# Patient Record
Sex: Female | Born: 1990 | Race: Black or African American | Hispanic: No | Marital: Single | Smoking: Current every day smoker
Health system: Southern US, Community
[De-identification: ages and names within clinical notes are randomized; demographics above are authoritative.]

## PROBLEM LIST (undated history)

## (undated) ENCOUNTER — Inpatient Hospital Stay (HOSPITAL_COMMUNITY): Payer: Self-pay

## (undated) DIAGNOSIS — O99345 Other mental disorders complicating the puerperium: Secondary | ICD-10-CM

## (undated) DIAGNOSIS — R569 Unspecified convulsions: Secondary | ICD-10-CM

## (undated) DIAGNOSIS — IMO0001 Reserved for inherently not codable concepts without codable children: Secondary | ICD-10-CM

## (undated) DIAGNOSIS — F53 Postpartum depression: Secondary | ICD-10-CM

## (undated) DIAGNOSIS — T8859XA Other complications of anesthesia, initial encounter: Secondary | ICD-10-CM

## (undated) DIAGNOSIS — K219 Gastro-esophageal reflux disease without esophagitis: Secondary | ICD-10-CM

## (undated) DIAGNOSIS — I10 Essential (primary) hypertension: Secondary | ICD-10-CM

## (undated) DIAGNOSIS — A749 Chlamydial infection, unspecified: Secondary | ICD-10-CM

## (undated) DIAGNOSIS — T4145XA Adverse effect of unspecified anesthetic, initial encounter: Secondary | ICD-10-CM

## (undated) HISTORY — DX: Other mental disorders complicating the puerperium: O99.345

## (undated) HISTORY — DX: Other complications of anesthesia, initial encounter: T88.59XA

## (undated) HISTORY — PX: DILATION AND CURETTAGE OF UTERUS: SHX78

## (undated) HISTORY — DX: Essential (primary) hypertension: I10

## (undated) HISTORY — DX: Reserved for inherently not codable concepts without codable children: IMO0001

## (undated) HISTORY — DX: Gastro-esophageal reflux disease without esophagitis: K21.9

## (undated) HISTORY — DX: Postpartum depression: F53.0

## (undated) HISTORY — DX: Adverse effect of unspecified anesthetic, initial encounter: T41.45XA

## (undated) HISTORY — DX: Chlamydial infection, unspecified: A74.9

---
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
## 2008-10-08 ENCOUNTER — Inpatient Hospital Stay (HOSPITAL_COMMUNITY): Admission: AD | Admit: 2008-10-08 | Discharge: 2008-10-08 | Payer: Self-pay | Admitting: Obstetrics & Gynecology

## 2008-11-11 ENCOUNTER — Inpatient Hospital Stay (HOSPITAL_COMMUNITY): Admission: AD | Admit: 2008-11-11 | Discharge: 2008-11-11 | Payer: Self-pay | Admitting: Obstetrics & Gynecology

## 2009-01-27 ENCOUNTER — Inpatient Hospital Stay (HOSPITAL_COMMUNITY): Admission: AD | Admit: 2009-01-27 | Discharge: 2009-01-27 | Payer: Self-pay | Admitting: Obstetrics & Gynecology

## 2009-01-27 ENCOUNTER — Ambulatory Visit: Payer: Self-pay | Admitting: Advanced Practice Midwife

## 2009-02-22 ENCOUNTER — Inpatient Hospital Stay (HOSPITAL_COMMUNITY): Admission: AD | Admit: 2009-02-22 | Discharge: 2009-02-24 | Payer: Self-pay | Admitting: Obstetrics and Gynecology

## 2009-02-22 ENCOUNTER — Ambulatory Visit: Payer: Self-pay | Admitting: Family

## 2009-05-31 LAB — GC/CHLAMYDIA PROBE AMP, GENITAL: Chlamydia: NEGATIVE

## 2009-06-05 ENCOUNTER — Emergency Department (HOSPITAL_COMMUNITY): Admission: EM | Admit: 2009-06-05 | Discharge: 2009-06-05 | Payer: Self-pay | Admitting: Emergency Medicine

## 2009-07-09 ENCOUNTER — Emergency Department (HOSPITAL_COMMUNITY): Admission: EM | Admit: 2009-07-09 | Discharge: 2009-07-09 | Payer: Self-pay | Admitting: Emergency Medicine

## 2010-03-05 ENCOUNTER — Inpatient Hospital Stay (HOSPITAL_COMMUNITY): Admission: AD | Admit: 2010-03-05 | Discharge: 2010-03-05 | Payer: Self-pay | Admitting: Obstetrics & Gynecology

## 2010-03-05 ENCOUNTER — Ambulatory Visit: Payer: Self-pay | Admitting: Obstetrics and Gynecology

## 2010-03-13 ENCOUNTER — Ambulatory Visit (HOSPITAL_COMMUNITY): Admission: RE | Admit: 2010-03-13 | Discharge: 2010-03-13 | Payer: Self-pay | Admitting: Obstetrics & Gynecology

## 2010-03-24 ENCOUNTER — Ambulatory Visit: Payer: Self-pay | Admitting: Family Medicine

## 2010-03-24 ENCOUNTER — Inpatient Hospital Stay (HOSPITAL_COMMUNITY): Admission: AD | Admit: 2010-03-24 | Discharge: 2010-03-24 | Payer: Self-pay | Admitting: Obstetrics & Gynecology

## 2010-05-07 ENCOUNTER — Inpatient Hospital Stay (HOSPITAL_COMMUNITY)
Admission: AD | Admit: 2010-05-07 | Discharge: 2010-05-07 | Payer: Self-pay | Source: Home / Self Care | Attending: Family Medicine | Admitting: Family Medicine

## 2010-05-24 ENCOUNTER — Inpatient Hospital Stay (HOSPITAL_COMMUNITY)
Admission: AD | Admit: 2010-05-24 | Discharge: 2010-05-24 | Payer: Self-pay | Source: Home / Self Care | Attending: Obstetrics & Gynecology | Admitting: Obstetrics & Gynecology

## 2010-05-26 ENCOUNTER — Inpatient Hospital Stay (HOSPITAL_COMMUNITY)
Admission: AD | Admit: 2010-05-26 | Discharge: 2010-05-26 | Payer: Self-pay | Source: Home / Self Care | Attending: Obstetrics & Gynecology | Admitting: Obstetrics & Gynecology

## 2010-05-28 DIAGNOSIS — R569 Unspecified convulsions: Secondary | ICD-10-CM

## 2010-05-28 HISTORY — DX: Unspecified convulsions: R56.9

## 2010-05-31 ENCOUNTER — Encounter: Payer: Self-pay | Admitting: Obstetrics and Gynecology

## 2010-05-31 ENCOUNTER — Ambulatory Visit
Admission: RE | Admit: 2010-05-31 | Discharge: 2010-05-31 | Payer: Self-pay | Source: Home / Self Care | Attending: Obstetrics and Gynecology | Admitting: Obstetrics and Gynecology

## 2010-05-31 LAB — CONVERTED CEMR LAB
Chlamydia, Swab/Urine, PCR: NEGATIVE
GC Probe Amp, Urine: NEGATIVE

## 2010-05-31 LAB — POCT URINALYSIS DIPSTICK
Bilirubin Urine: NEGATIVE
Hemoglobin, Urine: NEGATIVE
Ketones, ur: 80 mg/dL — AB
Nitrite: NEGATIVE
Protein, ur: NEGATIVE mg/dL
Specific Gravity, Urine: 1.01 (ref 1.005–1.030)
Urine Glucose, Fasting: NEGATIVE mg/dL
Urobilinogen, UA: 4 mg/dL — ABNORMAL HIGH (ref 0.0–1.0)
pH: 6.5 (ref 5.0–8.0)

## 2010-05-31 LAB — GLUCOSE, CAPILLARY: Glucose-Capillary: 88 mg/dL (ref 70–99)

## 2010-06-14 ENCOUNTER — Ambulatory Visit: Admit: 2010-06-14 | Payer: Self-pay | Admitting: Obstetrics and Gynecology

## 2010-06-21 ENCOUNTER — Ambulatory Visit
Admission: RE | Admit: 2010-06-21 | Discharge: 2010-06-21 | Payer: Self-pay | Source: Home / Self Care | Attending: Obstetrics and Gynecology | Admitting: Obstetrics and Gynecology

## 2010-06-21 ENCOUNTER — Inpatient Hospital Stay (HOSPITAL_COMMUNITY)
Admission: AD | Admit: 2010-06-21 | Discharge: 2010-06-21 | Payer: Self-pay | Source: Home / Self Care | Attending: Obstetrics & Gynecology | Admitting: Obstetrics & Gynecology

## 2010-06-21 LAB — COMPREHENSIVE METABOLIC PANEL
ALT: 10 U/L (ref 0–35)
AST: 17 U/L (ref 0–37)
Albumin: 2.5 g/dL — ABNORMAL LOW (ref 3.5–5.2)
Alkaline Phosphatase: 234 U/L — ABNORMAL HIGH (ref 39–117)
BUN: 3 mg/dL — ABNORMAL LOW (ref 6–23)
CO2: 23 mEq/L (ref 19–32)
Calcium: 8.7 mg/dL (ref 8.4–10.5)
Chloride: 110 mEq/L (ref 96–112)
Creatinine, Ser: 0.78 mg/dL (ref 0.4–1.2)
GFR calc Af Amer: >60 mL/min (ref >60)
GFR calc non Af Amer: >60 mL/min (ref >60)
Glucose, Bld: 79 mg/dL (ref 70–99)
Potassium: 3.9 mEq/L (ref 3.5–5.1)
Sodium: 138 mEq/L (ref 135–145)
Total Bilirubin: 0.4 mg/dL (ref 0.3–1.2)
Total Protein: 5.9 g/dL — ABNORMAL LOW (ref 6.0–8.3)

## 2010-06-21 LAB — POCT URINALYSIS DIPSTICK
Bilirubin Urine: NEGATIVE
Hgb urine dipstick: NEGATIVE
Ketones, ur: NEGATIVE mg/dL
Nitrite: NEGATIVE
Protein, ur: NEGATIVE mg/dL
Specific Gravity, Urine: 1.01 (ref 1.005–1.030)
Urine Glucose, Fasting: NEGATIVE mg/dL
Urobilinogen, UA: 0.2 mg/dL (ref 0.0–1.0)
pH: 6.5 (ref 5.0–8.0)

## 2010-06-21 LAB — CBC
HCT: 31.1 % — ABNORMAL LOW (ref 36.0–46.0)
Hemoglobin: 10.1 g/dL — ABNORMAL LOW (ref 12.0–15.0)
MCH: 30.9 pg (ref 26.0–34.0)
MCHC: 32.5 g/dL (ref 30.0–36.0)
MCV: 95.1 fL (ref 78.0–100.0)
Platelets: 210 10*3/uL (ref 150–400)
RBC: 3.27 MIL/uL — ABNORMAL LOW (ref 3.87–5.11)
RDW: 13 % (ref 11.5–15.5)
WBC: 8.4 10*3/uL (ref 4.0–10.5)

## 2010-06-21 LAB — URINALYSIS, ROUTINE W REFLEX MICROSCOPIC
Bilirubin Urine: NEGATIVE
Hgb urine dipstick: NEGATIVE
Ketones, ur: NEGATIVE mg/dL
Nitrite: NEGATIVE
Protein, ur: NEGATIVE mg/dL
Specific Gravity, Urine: 1.01 (ref 1.005–1.030)
Urine Glucose, Fasting: NEGATIVE mg/dL
Urobilinogen, UA: 0.2 mg/dL (ref 0.0–1.0)
pH: 7 (ref 5.0–8.0)

## 2010-06-21 LAB — URINE MICROSCOPIC-ADD ON

## 2010-06-21 LAB — PROTEIN / CREATININE RATIO, URINE
Creatinine, Urine: 79.8 mg/dL
Protein Creatinine Ratio: 0.13 (ref 0.00–0.15)
Total Protein, Urine: 10 mg/dL

## 2010-06-22 ENCOUNTER — Inpatient Hospital Stay (HOSPITAL_COMMUNITY)
Admission: AD | Admit: 2010-06-22 | Discharge: 2010-06-25 | Payer: Self-pay | Source: Home / Self Care | Attending: Obstetrics & Gynecology | Admitting: Obstetrics & Gynecology

## 2010-06-22 LAB — CBC
HCT: 30.7 % — ABNORMAL LOW (ref 36.0–46.0)
Hemoglobin: 10 g/dL — ABNORMAL LOW (ref 12.0–15.0)
MCH: 31 pg (ref 26.0–34.0)
MCHC: 32.6 g/dL (ref 30.0–36.0)
MCV: 95 fL (ref 78.0–100.0)
Platelets: 204 10*3/uL (ref 150–400)
RBC: 3.23 MIL/uL — ABNORMAL LOW (ref 3.87–5.11)
RDW: 13.1 % (ref 11.5–15.5)
WBC: 9.7 10*3/uL (ref 4.0–10.5)

## 2010-06-22 LAB — RPR: RPR Ser Ql: NONREACTIVE
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
## 2010-06-24 LAB — CBC
HCT: 26.6 % — ABNORMAL LOW (ref 36.0–46.0)
Hemoglobin: 8.8 g/dL — ABNORMAL LOW (ref 12.0–15.0)
MCH: 31.2 pg (ref 26.0–34.0)
MCHC: 33.1 g/dL (ref 30.0–36.0)
MCV: 94.3 fL (ref 78.0–100.0)
Platelets: 193 10*3/uL (ref 150–400)
RBC: 2.82 MIL/uL — ABNORMAL LOW (ref 3.87–5.11)
RDW: 13 % (ref 11.5–15.5)
WBC: 10.7 10*3/uL — ABNORMAL HIGH (ref 4.0–10.5)

## 2010-07-01 ENCOUNTER — Inpatient Hospital Stay (HOSPITAL_COMMUNITY)
Admission: AD | Admit: 2010-07-01 | Discharge: 2010-07-02 | Disposition: A | Discharge status: Home / Self Care | Payer: Medicaid Other | Source: Ambulatory Visit | Attending: Obstetrics & Gynecology | Admitting: Obstetrics & Gynecology

## 2010-07-01 DIAGNOSIS — H5710 Ocular pain, unspecified eye: Secondary | ICD-10-CM

## 2010-07-01 DIAGNOSIS — O9989 Other specified diseases and conditions complicating pregnancy, childbirth and the puerperium: Secondary | ICD-10-CM

## 2010-07-01 DIAGNOSIS — R6884 Jaw pain: Secondary | ICD-10-CM | POA: Insufficient documentation

## 2010-07-01 DIAGNOSIS — O99893 Other specified diseases and conditions complicating puerperium: Secondary | ICD-10-CM | POA: Insufficient documentation

## 2010-07-01 LAB — COMPREHENSIVE METABOLIC PANEL
ALT: 15 U/L (ref 0–35)
AST: 20 U/L (ref 0–37)
Albumin: 3 g/dL — ABNORMAL LOW (ref 3.5–5.2)
Alkaline Phosphatase: 127 U/L — ABNORMAL HIGH (ref 39–117)
BUN: 8 mg/dL (ref 6–23)
CO2: 23 mEq/L (ref 19–32)
Calcium: 9 mg/dL (ref 8.4–10.5)
Chloride: 110 mEq/L (ref 96–112)
Creatinine, Ser: 0.77 mg/dL (ref 0.4–1.2)
GFR calc Af Amer: >60 mL/min (ref >60)
GFR calc non Af Amer: >60 mL/min (ref >60)
Glucose, Bld: 86 mg/dL (ref 70–99)
Potassium: 4.5 mEq/L (ref 3.5–5.1)
Sodium: 141 mEq/L (ref 135–145)
Total Bilirubin: 0.4 mg/dL (ref 0.3–1.2)
Total Protein: 6 g/dL (ref 6.0–8.3)

## 2010-07-01 LAB — URINALYSIS, ROUTINE W REFLEX MICROSCOPIC
Bilirubin Urine: NEGATIVE
Hgb urine dipstick: NEGATIVE
Ketones, ur: NEGATIVE mg/dL
Nitrite: NEGATIVE
Protein, ur: NEGATIVE mg/dL
Specific Gravity, Urine: 1.015 (ref 1.005–1.030)
Urine Glucose, Fasting: NEGATIVE mg/dL
Urobilinogen, UA: 1 mg/dL (ref 0.0–1.0)
pH: 6 (ref 5.0–8.0)

## 2010-07-01 LAB — LACTATE DEHYDROGENASE: LDH: 210 U/L (ref 94–250)

## 2010-07-01 LAB — CBC
HCT: 33.2 % — ABNORMAL LOW (ref 36.0–46.0)
Hemoglobin: 10.6 g/dL — ABNORMAL LOW (ref 12.0–15.0)
MCH: 31.2 pg (ref 26.0–34.0)
MCHC: 31.9 g/dL (ref 30.0–36.0)
MCV: 97.6 fL (ref 78.0–100.0)
Platelets: 369 10*3/uL (ref 150–400)
RBC: 3.4 MIL/uL — ABNORMAL LOW (ref 3.87–5.11)
RDW: 14 % (ref 11.5–15.5)
WBC: 7.2 10*3/uL (ref 4.0–10.5)

## 2010-07-01 LAB — URIC ACID: Uric Acid, Serum: 5.2 mg/dL (ref 2.4–7.0)

## 2010-07-13 ENCOUNTER — Ambulatory Visit: Payer: Medicaid Other | Admitting: Obstetrics & Gynecology

## 2010-08-07 LAB — URINALYSIS, ROUTINE W REFLEX MICROSCOPIC
Nitrite: NEGATIVE
Specific Gravity, Urine: <1.005 — ABNORMAL LOW (ref 1.005–1.030)
Urobilinogen, UA: 0.2 mg/dL (ref 0.0–1.0)
pH: 6 (ref 5.0–8.0)

## 2010-08-07 LAB — TYPE AND SCREEN: ABO/RH(D): O POS

## 2010-08-07 LAB — DIFFERENTIAL
Basophils Absolute: 0 10*3/uL (ref 0.0–0.1)
Eosinophils Relative: 1 % (ref 0–5)
Lymphocytes Relative: 29 % (ref 12–46)
Neutro Abs: 6.7 10*3/uL (ref 1.7–7.7)
Neutrophils Relative %: 64 % (ref 43–77)

## 2010-08-07 LAB — CBC
MCV: 95.9 fL (ref 78.0–100.0)
Platelets: 219 10*3/uL (ref 150–400)
RDW: 12.4 % (ref 11.5–15.5)
WBC: 10.8 10*3/uL — ABNORMAL HIGH (ref 4.0–10.5)

## 2010-08-07 LAB — STREP B DNA PROBE: Strep Group B Ag: NEGATIVE

## 2010-08-07 LAB — HEPATITIS B SURFACE ANTIGEN: Hepatitis B Surface Ag: NEGATIVE

## 2010-08-07 LAB — WET PREP, GENITAL

## 2010-08-07 LAB — RAPID URINE DRUG SCREEN, HOSP PERFORMED
Barbiturates: NOT DETECTED
Opiates: NOT DETECTED

## 2010-08-10 LAB — URINE MICROSCOPIC-ADD ON: RBC / HPF: NONE SEEN RBC/hpf (ref <3)

## 2010-08-10 LAB — URINALYSIS, ROUTINE W REFLEX MICROSCOPIC
Ketones, ur: NEGATIVE mg/dL
Nitrite: NEGATIVE
Protein, ur: NEGATIVE mg/dL
pH: 6.5 (ref 5.0–8.0)

## 2010-08-23 ENCOUNTER — Ambulatory Visit: Payer: Medicaid Other | Admitting: Obstetrics & Gynecology

## 2010-09-01 LAB — CBC
HCT: 36.5 % (ref 36.0–46.0)
HCT: 32.5 % — ABNORMAL LOW (ref 36.0–46.0)
Hemoglobin: 12.1 g/dL (ref 12.0–15.0)
MCHC: 33.3 g/dL (ref 30.0–36.0)
MCV: 102.2 fL — ABNORMAL HIGH (ref 78.0–100.0)
MCV: 103.1 fL — ABNORMAL HIGH (ref 78.0–100.0)
Platelets: 202 10*3/uL (ref 150–400)
RDW: 13.2 % (ref 11.5–15.5)
RDW: 12.8 % (ref 11.5–15.5)

## 2010-09-01 LAB — RAPID URINE DRUG SCREEN, HOSP PERFORMED
Cocaine: NOT DETECTED
Opiates: NOT DETECTED

## 2010-09-01 LAB — RUBELLA SCREEN: Rubella: 107.9 IU/mL — ABNORMAL HIGH

## 2010-09-01 LAB — DIFFERENTIAL
Basophils Relative: 0 % (ref 0–1)
Lymphocytes Relative: 16 % (ref 12–46)
Monocytes Relative: 5 % (ref 3–12)

## 2010-09-01 LAB — TYPE AND SCREEN: Antibody Screen: NEGATIVE

## 2010-09-01 LAB — RPR: RPR Ser Ql: NONREACTIVE

## 2010-09-01 LAB — SICKLE CELL SCREEN: Sickle Cell Screen: NEGATIVE

## 2010-09-01 LAB — HIV ANTIBODY (ROUTINE TESTING W REFLEX): HIV: NONREACTIVE

## 2010-09-04 LAB — WET PREP, GENITAL

## 2010-09-04 LAB — CBC
Hemoglobin: 10.1 g/dL — ABNORMAL LOW (ref 12.0–15.0)
MCHC: 33.8 g/dL (ref 30.0–36.0)
MCV: 102.8 fL — ABNORMAL HIGH (ref 78.0–100.0)
RBC: 2.91 MIL/uL — ABNORMAL LOW (ref 3.87–5.11)

## 2010-09-04 LAB — URINALYSIS, ROUTINE W REFLEX MICROSCOPIC
Bilirubin Urine: NEGATIVE
Ketones, ur: NEGATIVE mg/dL
Nitrite: NEGATIVE
Protein, ur: NEGATIVE mg/dL
Urobilinogen, UA: 0.2 mg/dL (ref 0.0–1.0)

## 2010-10-10 NOTE — Discharge Summary (Signed)
NAME:  Shirley Tran, Shirley Tran               ACCOUNT NO.:  [id number]   MEDICAL RECORD NO.:  [id number]          PATIENT TYPE:  INP   LOCATION:  9372                          FACILITY:  [facility]   PHYSICIAN:  Norton Blizzard, MD    DATE OF BIRTH:  Feb 21, 1991   DATE OF ADMISSION:  08/27/2007  DATE OF DISCHARGE:  08/28/2007                               DISCHARGE SUMMARY   ADMISSION DIAGNOSES:  1. Status post spontaneous vaginal delivery on August 14, 2007.  2. Severe range of blood pressures and headache.   DISCHARGE DIAGNOSES:  1. Status post spontaneous vaginal delivery on August 14, 2007.  2. Severe range of blood pressures and a headache.   PROCEDURES:  Magnesium sulfate administration for 24 hours.   HOSPITAL COURSE:  The patient is an 20 year old G2, P 1-0-1-1, status  post vaginal delivery on August 14, 2007.  The patient had no history of  hypertension in her prenatal care or in her immediate postpartum period.  She did present to [facility]U on August 27, 2007, with severe headache and on  evaluation was found to have blood pressures in  the 160 range for  systolic blood pressure and 120 range for diastolic blood pressure. Of  note, her PIH panel and labs were all negative and she had no other  evidence of end-organ dysfunction or damage.  Given her elevated blood  pressures and headaches that increased concern for possible  preeclampsia, she was admitted for magnesium sulfate administration.  The patient also was given labetalol 200 t.i.d. for management of her  blood pressures.  While on the magnesium sulfate, she had no signs or  symptoms of magnesium toxicity.  She tolerated her medications well.  Her blood pressures were in the 120s to 130s over 70s to 80s.  She did  have a mild headache, which was ameliorated with Motrin and Percocet.  The patient denied any other visual changes or right upper quadrant or  epigastric pain.  After her magnesium administration, the patient  desired to go home.   She was sent home after 2 hours of monitoring after  her magnesium therapy, and preeclampsia and eclampsia precautions were  reviewed with the patient.   CONDITION ON DISCHARGE:  The patient was discharged to home in stable  condition.   DISCHARGE MEDICATIONS:  1. Toprol-XL 50 mg p.o. daily.  2. Hydrochlorothiazide 25 mg p.o. daily.  3. Fioricet 1-2 tablets p.o. q.4 h. p.r.n. headaches.  4. Ibuprofen 800 mg p.o. t.i.d.   DISCHARGE INSTRUCTIONS:  The patient has an appointment for blood  pressure check in the [facility] on September 04, 2007, at 09:45 a.m.  This appointment time was communicated with the  patient.  The  patient was told to return to MAU for worsening headaches or any other  symptoms.  If the patient does return with worsening  headache, she  might need further imaging to exclude intracranial pathology.  The  patient was also told to follow up for her postpartum appointments as  previously scheduled and to return for any other postpartum concerns.  Norton Blizzard, MD  Electronically Signed     UAD/MEDQ  D:  08/28/2007  T:  08/29/2007  Job:  [id number]

## 2011-02-19 ENCOUNTER — Emergency Department (HOSPITAL_COMMUNITY)
Admission: EM | Admit: 2011-02-19 | Discharge: 2011-02-19 | Discharge status: Against Medical Advice | Payer: Medicaid Other | Attending: Emergency Medicine | Admitting: Emergency Medicine

## 2011-02-19 ENCOUNTER — Inpatient Hospital Stay (INDEPENDENT_AMBULATORY_CARE_PROVIDER_SITE_OTHER): Admit: 2011-02-19 | Discharge: 2011-02-19 | Disposition: A | Discharge status: Home / Self Care | Payer: Medicaid Other

## 2011-02-19 DIAGNOSIS — Z0389 Encounter for observation for other suspected diseases and conditions ruled out: Secondary | ICD-10-CM | POA: Insufficient documentation

## 2011-02-19 DIAGNOSIS — L03319 Cellulitis of trunk, unspecified: Secondary | ICD-10-CM

## 2011-02-19 LAB — RAPID HIV SCREEN (WH-MAU): Rapid HIV Screen: NONREACTIVE

## 2011-02-19 LAB — URINALYSIS, ROUTINE W REFLEX MICROSCOPIC
Ketones, ur: 15 — AB
Nitrite: NEGATIVE
Specific Gravity, Urine: <1.005 — ABNORMAL LOW
Urobilinogen, UA: 0.2
pH: 6

## 2011-02-19 LAB — COMPREHENSIVE METABOLIC PANEL
Albumin: 2.9 — ABNORMAL LOW
Alkaline Phosphatase: 208 — ABNORMAL HIGH
BUN: 5 — ABNORMAL LOW
Calcium: 8.7
Potassium: 3.2 — ABNORMAL LOW
Total Protein: 5.8 — ABNORMAL LOW

## 2011-02-19 LAB — CBC
Hemoglobin: 11.9 — ABNORMAL LOW
RBC: 3.49 — ABNORMAL LOW
RDW: 13.4

## 2011-02-19 LAB — URIC ACID: Uric Acid, Serum: 5.7

## 2011-02-19 LAB — LACTATE DEHYDROGENASE: LDH: 179

## 2011-02-19 LAB — RPR: RPR Ser Ql: NONREACTIVE

## 2011-02-20 LAB — COMPREHENSIVE METABOLIC PANEL
ALT: 16
Albumin: 3.1 — ABNORMAL LOW
Albumin: 3.8
Alkaline Phosphatase: 93
Alkaline Phosphatase: 66
BUN: 3 — ABNORMAL LOW
Calcium: 8.9
Chloride: 111
Creatinine, Ser: 0.98
Glucose, Bld: 88
Glucose, Bld: 83
Potassium: 4.6
Potassium: 4.1
Sodium: 140
Total Bilirubin: 0.6
Total Protein: 6.2

## 2011-02-20 LAB — URINALYSIS, ROUTINE W REFLEX MICROSCOPIC
Bilirubin Urine: NEGATIVE
Glucose, UA: NEGATIVE
Ketones, ur: NEGATIVE
Leukocytes, UA: NEGATIVE
Nitrite: NEGATIVE
Nitrite: NEGATIVE
Protein, ur: NEGATIVE
Specific Gravity, Urine: 1.02
Specific Gravity, Urine: 1.01
Urobilinogen, UA: 1
Urobilinogen, UA: 0.2
pH: 6
pH: 5.5

## 2011-02-20 LAB — CBC
HCT: 39.4
Hemoglobin: 13.4
MCHC: 33.4
MCV: 97.9
Platelets: 432 — ABNORMAL HIGH
Platelets: 305
RDW: 13.1
RDW: 13.1
WBC: 6.9

## 2011-02-20 LAB — URINE MICROSCOPIC-ADD ON

## 2011-02-20 LAB — URIC ACID
Uric Acid, Serum: 5.5
Uric Acid, Serum: 4.6

## 2011-03-06 LAB — URINALYSIS, ROUTINE W REFLEX MICROSCOPIC
Glucose, UA: NEGATIVE
Hgb urine dipstick: NEGATIVE
Specific Gravity, Urine: 1.01
pH: 6.5

## 2011-03-07 LAB — WET PREP, GENITAL: Yeast Wet Prep HPF POC: NONE SEEN

## 2011-03-07 LAB — URINALYSIS, ROUTINE W REFLEX MICROSCOPIC
Bilirubin Urine: NEGATIVE
Hgb urine dipstick: NEGATIVE
Specific Gravity, Urine: 1.025
Urobilinogen, UA: 0.2

## 2011-03-07 LAB — URINE MICROSCOPIC-ADD ON

## 2011-03-07 LAB — GC/CHLAMYDIA PROBE AMP, GENITAL
Chlamydia, DNA Probe: POSITIVE — AB
GC Probe Amp, Genital: POSITIVE — AB

## 2011-04-16 ENCOUNTER — Encounter: Payer: Self-pay | Admitting: Obstetrics and Gynecology

## 2011-04-27 ENCOUNTER — Ambulatory Visit: Payer: Medicaid Other | Admitting: Obstetrics & Gynecology

## 2011-05-21 ENCOUNTER — Emergency Department (HOSPITAL_COMMUNITY): Payer: Medicaid Other

## 2011-05-21 ENCOUNTER — Other Ambulatory Visit: Payer: Self-pay

## 2011-05-21 ENCOUNTER — Emergency Department (HOSPITAL_COMMUNITY)
Admission: EM | Admit: 2011-05-21 | Discharge: 2011-05-22 | Disposition: A | Discharge status: Home / Self Care | Payer: Medicaid Other | Attending: Emergency Medicine | Admitting: Emergency Medicine

## 2011-05-21 ENCOUNTER — Encounter (HOSPITAL_COMMUNITY): Payer: Self-pay

## 2011-05-21 DIAGNOSIS — S01501A Unspecified open wound of lip, initial encounter: Secondary | ICD-10-CM | POA: Insufficient documentation

## 2011-05-21 DIAGNOSIS — R000 Tachycardia, unspecified: Secondary | ICD-10-CM | POA: Insufficient documentation

## 2011-05-21 DIAGNOSIS — I10 Essential (primary) hypertension: Secondary | ICD-10-CM | POA: Insufficient documentation

## 2011-05-21 DIAGNOSIS — F17200 Nicotine dependence, unspecified, uncomplicated: Secondary | ICD-10-CM | POA: Insufficient documentation

## 2011-05-21 DIAGNOSIS — M542 Cervicalgia: Secondary | ICD-10-CM | POA: Insufficient documentation

## 2011-05-21 DIAGNOSIS — R51 Headache: Secondary | ICD-10-CM | POA: Insufficient documentation

## 2011-05-21 DIAGNOSIS — F1410 Cocaine abuse, uncomplicated: Secondary | ICD-10-CM | POA: Insufficient documentation

## 2011-05-21 DIAGNOSIS — K219 Gastro-esophageal reflux disease without esophagitis: Secondary | ICD-10-CM | POA: Insufficient documentation

## 2011-05-21 DIAGNOSIS — N390 Urinary tract infection, site not specified: Secondary | ICD-10-CM | POA: Insufficient documentation

## 2011-05-21 DIAGNOSIS — R569 Unspecified convulsions: Secondary | ICD-10-CM | POA: Insufficient documentation

## 2011-05-21 DIAGNOSIS — IMO0002 Reserved for concepts with insufficient information to code with codable children: Secondary | ICD-10-CM | POA: Insufficient documentation

## 2011-05-21 DIAGNOSIS — Z331 Pregnant state, incidental: Secondary | ICD-10-CM | POA: Insufficient documentation

## 2011-05-21 DIAGNOSIS — X58XXXA Exposure to other specified factors, initial encounter: Secondary | ICD-10-CM | POA: Insufficient documentation

## 2011-05-21 LAB — HCG, QUANTITATIVE, PREGNANCY: hCG, Beta Chain, Quant, S: 1564 m[IU]/mL — ABNORMAL HIGH (ref <5)

## 2011-05-21 LAB — DIFFERENTIAL
Basophils Absolute: 0 10*3/uL (ref 0.0–0.1)
Lymphocytes Relative: 16 % (ref 12–46)
Neutro Abs: 7 10*3/uL (ref 1.7–7.7)
Neutrophils Relative %: 76 % (ref 43–77)

## 2011-05-21 LAB — COMPREHENSIVE METABOLIC PANEL
Alkaline Phosphatase: 69 U/L (ref 39–117)
BUN: 5 mg/dL — ABNORMAL LOW (ref 6–23)
Chloride: 102 mEq/L (ref 96–112)
GFR calc Af Amer: >90 mL/min (ref >90)
Glucose, Bld: 85 mg/dL (ref 70–99)
Potassium: 3 mEq/L — ABNORMAL LOW (ref 3.5–5.1)
Total Bilirubin: 0.8 mg/dL (ref 0.3–1.2)
Total Protein: 7.5 g/dL (ref 6.0–8.3)

## 2011-05-21 LAB — RAPID URINE DRUG SCREEN, HOSP PERFORMED
Amphetamines: NOT DETECTED
Opiates: NOT DETECTED

## 2011-05-21 LAB — CBC
Platelets: 309 10*3/uL (ref 150–400)
RDW: 12.3 % (ref 11.5–15.5)
WBC: 9.1 10*3/uL (ref 4.0–10.5)

## 2011-05-21 LAB — URINALYSIS, ROUTINE W REFLEX MICROSCOPIC
Nitrite: POSITIVE — AB
Specific Gravity, Urine: 1.011 (ref 1.005–1.030)
Urobilinogen, UA: 1 mg/dL (ref 0.0–1.0)

## 2011-05-21 LAB — POCT PREGNANCY, URINE: Preg Test, Ur: POSITIVE

## 2011-05-21 LAB — URINE MICROSCOPIC-ADD ON

## 2011-05-21 NOTE — ED Notes (Signed)
Mom heard a fall - observed daughter in the floor having a seizure - post ictal when FD arrived. Cut to right side of mouth - lip only - no incontinence - CBG 97 - no previous hx of seizures - C collar in place - Alert at present able to answer questions.#20 in Left AC by EMS.

## 2011-05-21 NOTE — ED Notes (Signed)
Pt placed on bedpan

## 2011-05-21 NOTE — ED Notes (Signed)
Ultrasound performed at bedside.

## 2011-05-21 NOTE — ED Provider Notes (Addendum)
History     CSN: [id number]  Arrival date & time 05/21/11  1947   First MD Initiated Contact with Patient 05/21/11 2020      Chief Complaint  Patient presents with  . Fall    (Consider location/radiation/quality/duration/timing/severity/associated sxs/prior treatment) HPI Patient had seizure resulting in fall as witnessed by her mother.. Mother heard the patient fall in the next room ran immediately to see the patient and found her with generalized seizure activity lasting 2 minutes, resolved spontaneously, followed by period of Confusion lasting several minutes. Presently complains she complains of diffuse headache and facial pain and neck pain. Mother reports that patient had "flulike symptoms a few days ago with cough and questionable fever. Headache and neck pain are moderate diffuse and nonradiating. Patient treated by EMS with long board hard collar and CID. Past Medical History  Diagnosis Date  . Postpartum depression     states "yes" but no treatment  . Chlamydia 2006    treated  . Candidiasis 2010    treatment  . Cystitis   . Hypertension   . Reflux     No past surgical history on file.  Family History  Problem Relation Age of Onset  . Heart disease Maternal Grandmother   . Thrombophlebitis Maternal Grandmother   . Diabetes Maternal Grandmother   . Anemia Mother   . Sickle cell trait Daughter     History  Substance Use Topics  . Smoking status: Current Everyday Smoker -- 3.0 packs/day  . Smokeless tobacco: Not on file  . Alcohol Use:     OB History    Grav Para Term Preterm Abortions TAB SAB Ect Mult Living   4 3 3  0 1 0 0 0 0 3      Review of Systems  Constitutional: Negative.   Respiratory: Positive for cough.   Cardiovascular: Negative.   Gastrointestinal: Negative.   Musculoskeletal: Negative.        Neck pain  Skin: Positive for wound.       Facial abrasion  Neurological: Positive for headaches.       Seizure  Hematological: Negative.     Psychiatric/Behavioral: Negative.   All other systems reviewed and are negative.    Allergies  Nickel  Home Medications   Current Outpatient Rx  Name Route Sig Dispense Refill  . NAPROXEN SODIUM 220 MG PO TABS Oral Take 220 mg by mouth daily as needed. For headache       BP 146/88  Pulse 125  Temp(Src) 98.6 F (37 C) (Oral)  Resp 20  Ht 5\' 3"  (1.6 m)  Wt 170 lb (77.111 kg)  BMI 30.11 kg/m2  LMP 05/01/2011  Physical Exam  Nursing note and vitals reviewed. Constitutional: She appears well-developed and well-nourished.  HENT:       Dime sized abrasion to right face immediately lateral to nose. Otherwise atraumatic. No septal hematoma bilateral tympanic membranes normal  Eyes: Conjunctivae are normal. Pupils are equal, round, and reactive to light.  Neck: No tracheal deviation present. No thyromegaly present.       Diffusely tender over C-spine  Cardiovascular: Regular rhythm.   No murmur heard.      Tachycardic  Pulmonary/Chest: Effort normal and breath sounds normal.  Abdominal: Soft. Bowel sounds are normal. She exhibits no distension. There is no tenderness.  Musculoskeletal: Normal range of motion. She exhibits no edema and no tenderness.       Entire spine nontender, pelvis stable. All 4 extremities without contusion abrasion  or swelling neurovascularly intact  Neurological: She is alert. She has normal reflexes. She displays normal reflexes. Coordination normal.  Skin: Skin is warm and dry. No rash noted.  Psychiatric: She has a normal mood and affect.   HEENT exam laceration mucosal surface of lower lip. ED Course  Procedures (including critical care time)  Labs Reviewed - No data to display No results found.   No diagnosis found.   Date: 05/22/2011  Rate: 125  Rhythm: sinus tachycardia  QRS Axis: normal  Intervals: normal  ST/T Wave abnormalities: normal  Conduction Disutrbances:none  Narrative Interpretation:   Old EKG Reviewed: none  available   MDM  Seizure threshold lowered secondary cocaine Spoke with Dr.Williis who see patient in follow up for seizure workup as outpatient Patient signed out to Dr. Preston Fleeting 12:45 AM Diagnosis #1 new onset seizure #2 cocaine intoxication #3 facial contusions and laceration #4 pregnancy        Doug Sou, MD 05/22/11 0046  Attempt has been made to contact GYN to arrange followup given her positive pregnancy test, beta hCG of over 1500, and failure to demonstrate definite IUP. Patient states she does not wish to wait, so she will be given the information for followup. She will also be started on antibiotics for her UTI and referred to Dr. Anne Hahn for outpatient evaluation of her seizure.  Dione Booze, MD 05/22/11 [phone]

## 2011-05-22 ENCOUNTER — Emergency Department (HOSPITAL_COMMUNITY): Payer: Medicaid Other

## 2011-05-22 MED ORDER — PRENATAL RX 60-1 MG PO TABS: 1.0000 | ORAL_TABLET | Freq: Every day | ORAL | Status: DC

## 2011-05-22 MED ORDER — NITROFURANTOIN MONOHYD MACRO 100 MG PO CAPS: 100.0000 mg | ORAL_CAPSULE | Freq: Two times a day (BID) | ORAL | Status: AC

## 2011-05-22 NOTE — ED Notes (Signed)
Pt has returned from ultrasound. Per ultrasound tech pt refused to have the transvaginal portion of the test done.

## 2011-07-25 ENCOUNTER — Inpatient Hospital Stay (HOSPITAL_COMMUNITY): Payer: Self-pay

## 2011-07-25 ENCOUNTER — Inpatient Hospital Stay (HOSPITAL_COMMUNITY)
Admission: AD | Admit: 2011-07-25 | Discharge: 2011-07-25 | Disposition: A | Discharge status: Home / Self Care | Payer: Self-pay | Source: Ambulatory Visit | Attending: Obstetrics & Gynecology | Admitting: Obstetrics & Gynecology

## 2011-07-25 ENCOUNTER — Encounter (HOSPITAL_COMMUNITY): Payer: Self-pay | Admitting: *Deleted

## 2011-07-25 DIAGNOSIS — I10 Essential (primary) hypertension: Secondary | ICD-10-CM | POA: Insufficient documentation

## 2011-07-25 DIAGNOSIS — R569 Unspecified convulsions: Secondary | ICD-10-CM | POA: Insufficient documentation

## 2011-07-25 DIAGNOSIS — O0289 Other abnormal products of conception: Secondary | ICD-10-CM | POA: Insufficient documentation

## 2011-07-25 DIAGNOSIS — O020 Blighted ovum and nonhydatidiform mole: Secondary | ICD-10-CM

## 2011-07-25 DIAGNOSIS — A599 Trichomoniasis, unspecified: Secondary | ICD-10-CM | POA: Insufficient documentation

## 2011-07-25 HISTORY — DX: Unspecified convulsions: R56.9

## 2011-07-25 LAB — CBC
Platelets: 274 10*3/uL (ref 150–400)
RDW: 11.8 % (ref 11.5–15.5)
WBC: 5.8 10*3/uL (ref 4.0–10.5)

## 2011-07-25 LAB — URINALYSIS, ROUTINE W REFLEX MICROSCOPIC
Protein, ur: 30 mg/dL — AB
Specific Gravity, Urine: 1.02 (ref 1.005–1.030)
Urobilinogen, UA: 1 mg/dL (ref 0.0–1.0)

## 2011-07-25 LAB — URINE MICROSCOPIC-ADD ON

## 2011-07-25 LAB — WET PREP, GENITAL: Yeast Wet Prep HPF POC: NONE SEEN

## 2011-07-25 LAB — HCG, QUANTITATIVE, PREGNANCY: hCG, Beta Chain, Quant, S: 7991 m[IU]/mL — ABNORMAL HIGH (ref <5)

## 2011-07-25 MED ORDER — HYDROCODONE-ACETAMINOPHEN 5-500 MG PO TABS: 1.0000 | ORAL_TABLET | Freq: Four times a day (QID) | ORAL | Status: AC | PRN

## 2011-07-25 MED ORDER — MISOPROSTOL 200 MCG PO TABS: ORAL_TABLET | ORAL | Status: DC

## 2011-07-25 MED ORDER — METRONIDAZOLE 500 MG PO TABS: 500.0000 mg | ORAL_TABLET | Freq: Two times a day (BID) | ORAL | Status: AC

## 2011-07-25 MED ORDER — PROMETHAZINE HCL 25 MG PO TABS: 25.0000 mg | ORAL_TABLET | Freq: Four times a day (QID) | ORAL | Status: DC | PRN

## 2011-07-25 NOTE — Progress Notes (Signed)
Patient states she had a seizure on 12-24 and was at [facility]. Had a positive pregnancy test but has not had any care since that time. States she plans to terminate this pregnancy but started bleeding 2-25 and has spotting on and off. Not wearing a pad at this time. States some abdominal cramping. Does not remember her LMP, possible in November.
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
## 2011-07-25 NOTE — ED Provider Notes (Signed)
Attestation of Attending Supervision of Advanced Practitioner: Evaluation and management procedures were performed by the PA/NP/CNM/OB Fellow under my supervision/collaboration. Chart reviewed, and agree with management and plan.  Jaynie Collins, M.D. 07/25/2011 10:50 AM

## 2011-07-25 NOTE — Discharge Instructions (Signed)
Trichomoniasis Trichomoniasis is an infection, caused by the Trichomonas organism, that affects both women and men. In women, the outer female genitalia and the vagina are affected. In men, the penis is mainly affected, but the prostate and other reproductive organs can also be involved. Trichomoniasis is a sexually transmitted disease (STD) and is most often passed to another person through sexual contact. The majority of people who get trichomoniasis do so from a sexual encounter and are also at risk for other STDs. CAUSES   Sexual intercourse with an infected partner.   It can be present in swimming pools or hot tubs.  SYMPTOMS   Abnormal gray-green frothy vaginal discharge in women.   Vaginal itching and irritation in women.   Itching and irritation of the area outside the vagina in women.   Penile discharge with or without pain in males.   Inflammation of the urethra (urethritis), causing painful urination.   Bleeding after sexual intercourse.  RELATED COMPLICATIONS  Pelvic inflammatory disease.   Infection of the uterus (endometritis).   Infertility.   Tubal (ectopic) pregnancy.   It can be associated with other STDs, including gonorrhea and chlamydia, hepatitis B, and HIV.  COMPLICATIONS DURING PREGNANCY  Early (premature) delivery.   Premature rupture of the membranes (PROM).   Low birth weight.  DIAGNOSIS   Visualization of Trichomonas under the microscope from the vagina discharge.   Ph of the vagina greater than 4.5, tested with a test tape.   Trich Rapid Test.   Culture of the organism, but this is not usually needed.   It may be found on a Pap test.   Having a "strawberry cervix,"which means the cervix looks very red like a strawberry.  TREATMENT   You may be given medication to fight the infection. Inform your caregiver if you could be or are pregnant. Some medications used to treat the infection should not be taken during pregnancy.    Over-the-counter medications or creams to decrease itching or irritation may be recommended.   Your sexual partner will need to be treated if infected.  HOME CARE INSTRUCTIONS   Take all medication prescribed by your caregiver.   Take over-the-counter medication for itching or irritation as directed by your caregiver.   Do not have sexual intercourse while you have the infection.   Do not douche or wear tampons.   Discuss your infection with your partner, as your partner may have acquired the infection from you. Or, your partner may have been the person who transmitted the infection to you.   Have your sex partner examined and treated if necessary.   Practice safe, informed, and protected sex.   See your caregiver for other STD testing.  SEEK MEDICAL CARE IF:   You still have symptoms after you finish the medication.   You have an oral temperature above 102 F (38.9 C).   You develop belly (abdominal) pain.   You have pain when you urinate.   You have bleeding after sexual intercourse.   You develop a rash.   The medication makes you sick or makes you throw up (vomit).  Document Released: 11/07/2000 Document Revised: 01/24/2011 Document Reviewed: 12/03/2008 [employer] Patient Information 2012 [employer]. 

## 2011-07-28 ENCOUNTER — Inpatient Hospital Stay (HOSPITAL_COMMUNITY): Payer: Self-pay

## 2011-07-28 ENCOUNTER — Inpatient Hospital Stay (HOSPITAL_COMMUNITY)
Admission: AD | Admit: 2011-07-28 | Discharge: 2011-07-29 | Disposition: A | Discharge status: Home Health | Payer: Self-pay | Source: Ambulatory Visit | Attending: Obstetrics and Gynecology | Admitting: Obstetrics and Gynecology

## 2011-07-28 ENCOUNTER — Encounter (HOSPITAL_COMMUNITY): Payer: Self-pay

## 2011-07-28 DIAGNOSIS — O039 Complete or unspecified spontaneous abortion without complication: Secondary | ICD-10-CM | POA: Insufficient documentation

## 2011-07-28 LAB — CBC
Hemoglobin: 9 g/dL — ABNORMAL LOW (ref 12.0–15.0)
MCHC: 33.6 g/dL (ref 30.0–36.0)
RBC: 2.73 MIL/uL — ABNORMAL LOW (ref 3.87–5.11)
WBC: 8 10*3/uL (ref 4.0–10.5)

## 2011-07-28 LAB — HCG, QUANTITATIVE, PREGNANCY: hCG, Beta Chain, Quant, S: 3870 m[IU]/mL — ABNORMAL HIGH (ref <5)

## 2011-07-28 LAB — PREPARE RBC (CROSSMATCH)

## 2011-07-28 MED ORDER — HYDROMORPHONE HCL PF 1 MG/ML IJ SOLN
INTRAMUSCULAR | Status: AC
  Filled 2011-07-28: qty 2

## 2011-07-28 MED ORDER — HYDROMORPHONE HCL PF 1 MG/ML IJ SOLN
2.0000 mg | Freq: Once | INTRAMUSCULAR | Status: AC
  Administered 2011-07-28: 2 mg via INTRAVENOUS

## 2011-07-28 MED ORDER — ONDANSETRON HCL 4 MG/2ML IJ SOLN
4.0000 mg | Freq: Once | INTRAMUSCULAR | Status: AC
  Administered 2011-07-29: 4 mg via INTRAVENOUS
  Filled 2011-07-28: qty 2

## 2011-07-28 NOTE — ED Provider Notes (Signed)
History     Chief Complaint  Shirley Tran presents with  . Vaginal Bleeding   HPI  Pt brought in EMS with report of increased vaginal bleeding that started today with the passage of clots.  Pt seen on 07/25/11 and diagnosed with 9 wk anembryonic pregnancy.  Pt discharged home, did not insert cytotec at home.  Pt reports increased cramping and bleeding and feeling light headed.    Past Medical History  Diagnosis Date  . Postpartum depression     states "yes" but no treatment  . Chlamydia 2006    treated  . Candidiasis 2010    treatment  . Cystitis   . Hypertension   . Reflux   . Seizures     Last seizure in December    No past surgical history on file.  Family History  Problem Relation Age of Onset  . Heart disease Maternal Grandmother   . Thrombophlebitis Maternal Grandmother   . Diabetes Maternal Grandmother   . Anemia Mother   . Sickle cell trait Daughter     History  Substance Use Topics  . Smoking status: Current Everyday Smoker -- 0.2 packs/day for 7 years    Types: Cigarettes  . Smokeless tobacco: Not on file  . Alcohol Use: Yes     Pt states she has alcohol approx 3 times a week.    Allergies:  Allergies  Allergen Reactions  . Nickel Rash    Prescriptions prior to admission  Medication Sig Dispense Refill  . HYDROcodone-acetaminophen (VICODIN) 5-500 MG per tablet Take 1 tablet by mouth every 6 (six) hours as needed for pain.  30 tablet  0  . ibuprofen (ADVIL,MOTRIN) 200 MG tablet Take 200 mg by mouth every 6 (six) hours as needed. Takes for pain      . metroNIDAZOLE (FLAGYL) 500 MG tablet Take 1 tablet (500 mg total) by mouth 2 (two) times daily.  14 tablet  0  . misoprostol (CYTOTEC) 200 MCG tablet Take all four tabs by mouth as a single dose  4 tablet  0  . promethazine (PHENERGAN) 25 MG tablet Take 1 tablet (25 mg total) by mouth every 6 (six) hours as needed for nausea.  30 tablet  0    Review of Systems  Gastrointestinal: Positive for abdominal  pain.  Genitourinary:       Vaginal bleeding  Neurological: Positive for dizziness.  All other systems reviewed and are negative.   Physical Exam   Blood pressure 140/54, pulse 124, temperature 98.6 F (37 C), temperature source Oral, resp. rate 20, last menstrual period 05/01/2011, SpO2 100.00%.  Physical Exam  Constitutional: She is oriented to person, place, and time. She appears well-developed and well-nourished. She appears distressed ( increased cramping and pain).       Uncomfortable appearing  HENT:  Head: Normocephalic.  Neck: Normal range of motion. Neck supple.  Cardiovascular: Normal rate, regular rhythm and normal heart sounds.  Exam reveals no gallop and no friction rub.   No murmur heard. Respiratory: Effort normal and breath sounds normal. No respiratory distress.  GI: Soft. She exhibits no mass. There is tenderness (suprapubic). There is no rebound and no guarding.  Genitourinary: There is bleeding (moderate; +clots) around the vagina.       Small amount of tissue teased from cervix  Neurological: She is alert and oriented to person, place, and time.  Skin: Skin is warm and dry.    MAU Course  Procedures Ultrasound: IMPRESSION:  1. No evidence  for intrauterine pregnancy. In the setting of a  positive pregnancy test findings should be followed up with serial  Beta HCG and pelvic sonography.  2. Mild heterogeneous and thickened endometrium.  3. Small amount of free fluid within the pelvis.   Results for orders placed during the hospital encounter of 07/28/11 (from the past 24 hour(s))  CBC     Status: Abnormal   Collection Time   07/28/11 10:45 PM      Component Value Range   WBC 8.0  4.0 - 10.5 (K/uL)   RBC 2.73 (*) 3.87 - 5.11 (MIL/uL)   Hemoglobin 9.0 (*) 12.0 - 15.0 (g/dL)   HCT 40.9 (*) 81.1 - 46.0 (%)   MCV 98.2  78.0 - 100.0 (fL)   MCH 33.0  26.0 - 34.0 (pg)   MCHC 33.6  30.0 - 36.0 (g/dL)   RDW 91.4 (*) 78.2 - 15.5 (%)   Platelets 281  150 - 400  (K/uL)  HCG, QUANTITATIVE, PREGNANCY     Status: Abnormal   Collection Time   07/28/11 10:56 PM      Component Value Range   hCG, Beta Chain, Quant, S 3870 (*) <5 (mIU/mL)  TYPE AND SCREEN     Status: Normal (Preliminary result)   Collection Time   07/28/11 10:57 PM      Component Value Range   ABO/RH(D) O POS     Antibody Screen NEG     Sample Expiration 07/31/2011     Unit Number [id number]     Blood Component Type RBC LR PHER1     Unit division 00     Status of Unit ALLOCATED     Transfusion Status OK TO TRANSFUSE     Crossmatch Result Compatible     Unit Number [id number]     Blood Component Type RBC LR PHER1     Unit division 00     Status of Unit ALLOCATED     Transfusion Status OK TO TRANSFUSE     Crossmatch Result Compatible    PREPARE RBC (CROSSMATCH)     Status: Normal   Collection Time   07/28/11 11:00 PM      Component Value Range   Order Confirmation ORDER PROCESSED BY BLOOD BANK      Dr. Jolayne Panther called to room to evaluate bleeding of Shirley Tran for possible D&C  Reviewed lab and ultrasound results with Dr. Jolayne Panther > RX methergine PO x 24 hrs with follow-up in gyn in two weeks. Assessment and Plan  Miscarriage  Plan: DC to home RX Methergine Bleeding precautions Follow-up in gyn in 2 weeks  [facility] 07/28/2011, 10:57 PM

## 2011-07-28 NOTE — Progress Notes (Signed)
Patient is brought in by ems with large amount of vaginal bleeding with clots, patient is c/o lower abdominal cramping, feeling dizzy. She has a 18g on her left wrist infusing LR. She is getting 2l/Coggon and sat 100%. She rates her pain 8/10. walidah muhammed cnm is called in to evaluate patient

## 2011-07-29 MED ORDER — METHYLERGONOVINE MALEATE 0.2 MG PO TABS: 0.2000 mg | ORAL_TABLET | Freq: Four times a day (QID) | ORAL | Status: DC

## 2011-07-29 NOTE — Progress Notes (Signed)
Assessed pt bleeding>bleeding scant, dark red; no active bleeding at this time.

## 2011-07-29 NOTE — ED Provider Notes (Signed)
Agree with above note. Patient evaluated by me and tissue removed from endocervical canal using ring forceps. Procedure well tolerated by patient. Vaginal bleeding significantly decreased following removal of POC.  [name] 07/29/2011 6:53 AM
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
## 2011-07-31 LAB — TYPE AND SCREEN: Antibody Screen: NEGATIVE

## 2012-05-27 ENCOUNTER — Encounter (HOSPITAL_COMMUNITY): Payer: Self-pay | Admitting: *Deleted

## 2012-05-27 ENCOUNTER — Emergency Department (HOSPITAL_COMMUNITY)
Admission: EM | Admit: 2012-05-27 | Discharge: 2012-05-28 | Discharge status: Against Medical Advice | Payer: Self-pay | Attending: Emergency Medicine | Admitting: Emergency Medicine

## 2012-05-27 DIAGNOSIS — F329 Major depressive disorder, single episode, unspecified: Secondary | ICD-10-CM | POA: Insufficient documentation

## 2012-05-27 DIAGNOSIS — B379 Candidiasis, unspecified: Secondary | ICD-10-CM | POA: Insufficient documentation

## 2012-05-27 DIAGNOSIS — F3289 Other specified depressive episodes: Secondary | ICD-10-CM | POA: Insufficient documentation

## 2012-05-27 DIAGNOSIS — F17200 Nicotine dependence, unspecified, uncomplicated: Secondary | ICD-10-CM | POA: Insufficient documentation

## 2012-05-27 DIAGNOSIS — Z8669 Personal history of other diseases of the nervous system and sense organs: Secondary | ICD-10-CM | POA: Insufficient documentation

## 2012-05-27 DIAGNOSIS — Z79899 Other long term (current) drug therapy: Secondary | ICD-10-CM | POA: Insufficient documentation

## 2012-05-27 DIAGNOSIS — Z8742 Personal history of other diseases of the female genital tract: Secondary | ICD-10-CM | POA: Insufficient documentation

## 2012-05-27 DIAGNOSIS — Z8719 Personal history of other diseases of the digestive system: Secondary | ICD-10-CM | POA: Insufficient documentation

## 2012-05-27 DIAGNOSIS — M940 Chondrocostal junction syndrome [Tietze]: Secondary | ICD-10-CM | POA: Insufficient documentation

## 2012-05-27 DIAGNOSIS — I10 Essential (primary) hypertension: Secondary | ICD-10-CM | POA: Insufficient documentation

## 2012-05-27 NOTE — ED Notes (Signed)
Pt c/o central/L chest pain radiating to L arm. Pt states drank ETOH x 5 shots starting at 2000 tonight. Pt denies cardiac hx other than HTN. Pt states she has had recurrent chest pain since 2009 which has never been this bad nor has she sought medical care for these sxs. Pt states hx of anxiety for which she takes medication.

## 2012-05-28 ENCOUNTER — Emergency Department (HOSPITAL_COMMUNITY): Payer: Self-pay

## 2012-05-28 LAB — BASIC METABOLIC PANEL
BUN: 4 mg/dL — ABNORMAL LOW (ref 6–23)
Calcium: 9.3 mg/dL (ref 8.4–10.5)
GFR calc Af Amer: >90 mL/min (ref >90)
GFR calc non Af Amer: >90 mL/min (ref >90)
Glucose, Bld: 89 mg/dL (ref 70–99)
Sodium: 140 mEq/L (ref 135–145)

## 2012-05-28 LAB — CBC
Hemoglobin: 12.8 g/dL (ref 12.0–15.0)
MCH: 31 pg (ref 26.0–34.0)
MCHC: 33.1 g/dL (ref 30.0–36.0)
RDW: 14.3 % (ref 11.5–15.5)

## 2012-05-28 MED ORDER — IBUPROFEN 800 MG PO TABS
800.0000 mg | ORAL_TABLET | Freq: Once | ORAL | Status: AC
  Administered 2012-05-28: 800 mg via ORAL
  Filled 2012-05-28: qty 1

## 2012-05-28 NOTE — ED Notes (Signed)
Pt was waiting on discharge papers and left AMA.

## 2012-05-28 NOTE — ED Notes (Signed)
ONG:[id number]<WU> Expected date:05/27/12<BR> Expected time:11:56 PM<BR> Means of arrival:Ambulance<BR> Comments:<BR> Hold for Triage 3

## 2012-05-28 NOTE — ED Provider Notes (Signed)
Medical screening examination/treatment/procedure(s) were performed by non-physician practitioner and as supervising physician I was immediately available for consultation/collaboration.  Sunnie Nielsen, MD 05/28/12 2256

## 2012-05-28 NOTE — ED Provider Notes (Signed)
History     CSN: [id number]  Arrival date & time 05/27/12  2344   First MD Initiated Contact with Patient 05/28/12 0011      Chief Complaint  Patient presents with  . Chest Pain    (Consider location/radiation/quality/duration/timing/severity/associated sxs/prior treatment) HPI History provided by pt.   Pt was at a party this evening, dancing and drinking alcohol, when she developed a sharp, non-radiating pain in the center of her chest.  Aggravated by leaning forward and deep inspiration.  Associated w/ paresthesias of LUE.  Denies fever, cough, SOB, abdominal pain, N/V, diaphoresis, LE edema/ttp.  Denies trauma and recent heavy lifting.  H/o HTN and smokes cigarettes but otherwise healthy.  No known FH of MI.  No RF for PE.  Past Medical History  Diagnosis Date  . Postpartum depression     states "yes" but no treatment  . Chlamydia 2006    treated  . Candidiasis 2010    treatment  . Cystitis   . Hypertension   . Reflux   . Seizures     Last seizure in December    Past Surgical History  Procedure Date  . Dilation and curettage of uterus     Family History  Problem Relation Age of Onset  . Heart disease Maternal Grandmother   . Thrombophlebitis Maternal Grandmother   . Diabetes Maternal Grandmother   . Anemia Mother   . Sickle cell trait Daughter     History  Substance Use Topics  . Smoking status: Current Every Day Smoker -- 0.2 packs/day for 7 years    Types: Cigarettes  . Smokeless tobacco: Not on file  . Alcohol Use: Yes     Comment: Pt states she has alcohol approx 3 times a week.    OB History    Grav Para Term Preterm Abortions TAB SAB Ect Mult Living   5 3 3  0 1 1 0 0 0 3      Review of Systems  All other systems reviewed and are negative.    Allergies  Nickel  Home Medications   Current Outpatient Rx  Name  Route  Sig  Dispense  Refill  . HYDROXYZINE PAMOATE 50 MG PO CAPS   Oral   Take 50 mg by mouth 2 (two) times daily as needed.  For anxiety         . LISINOPRIL PO   Oral   Take 1 tablet by mouth every evening.         Marland Kitchen SERTRALINE HCL 100 MG PO TABS   Oral   Take 100 mg by mouth every morning.         Marland Kitchen PROMETHAZINE HCL 25 MG PO TABS   Oral   Take 1 tablet (25 mg total) by mouth every 6 (six) hours as needed for nausea.   30 tablet   0     BP 139/80  Pulse 107  Temp 98.9 F (37.2 C) (Oral)  Resp 20  SpO2 98%  LMP 04/20/2012  Breastfeeding? Unknown  Physical Exam  Nursing note and vitals reviewed. Constitutional: She is oriented to person, place, and time. She appears well-developed and well-nourished. No distress.  HENT:  Head: Normocephalic and atraumatic.  Eyes:       Normal appearance  Neck: Normal range of motion.  Cardiovascular: Normal rate, regular rhythm and intact distal pulses.   Pulmonary/Chest: Effort normal and breath sounds normal. No respiratory distress.       No pleuritic pain reported.  Tenderness sternum and LSB.   Abdominal: Soft. Bowel sounds are normal. She exhibits no distension. There is no tenderness. There is no guarding.  Musculoskeletal: Normal range of motion.       No peripheral edema or calf tenderness  Neurological: She is alert and oriented to person, place, and time.  Skin: Skin is warm and dry. No rash noted.  Psychiatric: She has a normal mood and affect. Her behavior is normal.    ED Course  Procedures (including critical care time)  Date: 05/28/2012  Rate: 96  Rhythm: normal sinus rhythm  QRS Axis: normal  Intervals: normal  ST/T Wave abnormalities: normal  Conduction Disutrbances:first-degree A-V block   Narrative Interpretation:   Old EKG Reviewed: none available   Labs Reviewed  BASIC METABOLIC PANEL - Abnormal; Notable for the following:    BUN 4 (*)     All other components within normal limits  CBC  TROPONIN I  LAB REPORT - SCANNED   Dg Chest 2 View  05/28/2012  *RADIOLOGY REPORT*  Clinical Data: Chest pain  CHEST - 2 VIEW   Comparison: None.  Findings: Normal mediastinum and cardiac silhouette.  Normal pulmonary  vasculature.  No evidence of effusion, infiltrate, or pneumothorax.  No acute bony abnormality.  IMPRESSION: Normal chest radiograph   Original Report Authenticated By: Genevive Bi, M.D.      1. Costochondritis       MDM  21yo F w/ HTN presents w/ c/o non-traumatic CP w/ LUE paresthesias.  Started this evening while dancing and drinking at party.  Pain reproducible w/ palpation and ROM of RUE on exam.  EKG non-ischemic and CXR neg.  Discussed my suspicion for costochondritis or muscle strain and reassured her.  Recommended NSAID, ice and avoidance of aggravating activities.  Pt eloped before receiving her CXR results.          Otilio Miu, PA-C 05/28/12 [phone]

## 2012-07-01 ENCOUNTER — Emergency Department (HOSPITAL_COMMUNITY)
Admission: EM | Admit: 2012-07-01 | Discharge: 2012-07-01 | Disposition: A | Discharge status: Home / Self Care | Payer: Self-pay | Attending: Emergency Medicine | Admitting: Emergency Medicine

## 2012-07-01 ENCOUNTER — Encounter (HOSPITAL_COMMUNITY): Payer: Self-pay

## 2012-07-01 DIAGNOSIS — T43502A Poisoning by unspecified antipsychotics and neuroleptics, intentional self-harm, initial encounter: Secondary | ICD-10-CM | POA: Insufficient documentation

## 2012-07-01 DIAGNOSIS — F329 Major depressive disorder, single episode, unspecified: Secondary | ICD-10-CM

## 2012-07-01 DIAGNOSIS — T50901S Poisoning by unspecified drugs, medicaments and biological substances, accidental (unintentional), sequela: Secondary | ICD-10-CM | POA: Insufficient documentation

## 2012-07-01 DIAGNOSIS — T6592XA Toxic effect of unspecified substance, intentional self-harm, initial encounter: Secondary | ICD-10-CM | POA: Insufficient documentation

## 2012-07-01 DIAGNOSIS — K219 Gastro-esophageal reflux disease without esophagitis: Secondary | ICD-10-CM | POA: Insufficient documentation

## 2012-07-01 DIAGNOSIS — F10929 Alcohol use, unspecified with intoxication, unspecified: Secondary | ICD-10-CM

## 2012-07-01 DIAGNOSIS — T50901A Poisoning by unspecified drugs, medicaments and biological substances, accidental (unintentional), initial encounter: Secondary | ICD-10-CM

## 2012-07-01 DIAGNOSIS — F1010 Alcohol abuse, uncomplicated: Secondary | ICD-10-CM | POA: Insufficient documentation

## 2012-07-01 DIAGNOSIS — I10 Essential (primary) hypertension: Secondary | ICD-10-CM | POA: Insufficient documentation

## 2012-07-01 LAB — CBC
MCH: 31.1 pg (ref 26.0–34.0)
MCV: 94.4 fL (ref 78.0–100.0)
Platelets: 294 10*3/uL (ref 150–400)
RDW: 15.2 % (ref 11.5–15.5)
WBC: 11.2 10*3/uL — ABNORMAL HIGH (ref 4.0–10.5)

## 2012-07-01 LAB — COMPREHENSIVE METABOLIC PANEL
AST: 17 U/L (ref 0–37)
Albumin: 3.5 g/dL (ref 3.5–5.2)
Calcium: 8.6 mg/dL (ref 8.4–10.5)
Creatinine, Ser: 0.69 mg/dL (ref 0.50–1.10)
Sodium: 141 mEq/L (ref 135–145)

## 2012-07-01 MED ORDER — ONDANSETRON 4 MG PO TBDP
4.0000 mg | ORAL_TABLET | Freq: Once | ORAL | Status: AC
  Administered 2012-07-01: 4 mg via ORAL
  Filled 2012-07-01: qty 1

## 2012-07-01 NOTE — ED Notes (Signed)
Pt has vomited twice, Dr requests her to be NPO

## 2012-07-01 NOTE — ED Notes (Signed)
Pt agreed to cooperate if we would unhandcuff her and unrestrain her, she stated that she just wanted to go to sleep now. Pt allowed [place] to draw blood and take vital signs, she also agreed to put the blue scrub pants on. Patient stated that she didn't want to see her mom if she comes to the ED, registration aware.

## 2012-07-01 NOTE — ED Provider Notes (Signed)
History     CSN: [id number]  Arrival date & time 07/01/12  0400   First MD Initiated Contact with Patient 07/01/12 [phone]      Chief Complaint  Patient presents with  . Drug Overdose    (Consider location/radiation/quality/duration/timing/severity/associated sxs/prior treatment) HPI Comments: Shirley Tran is a 22 y.o. Female who presents for evaluation of medication overdose with reported attempt to commit suicide. She has an involuntary commitment, taken out by an emergency provider earlier today.   I saw the patient. Briefly at 0800 hours, when she was asleep. She was reassessed at 12:30, she was awake, alert, and cooperative. An H&P is not documented yet.  The patient states at 1 AM today she took about 20 tablets of a combination of hydroxyzine 50 mg and Zoloft 100 mg. At the time she was drinking alcohol, sad, and not wanting to live. Now. She states that she is not suicidal and has no plans for self-harm. She drinks alcohol daily. She sees both a psychiatrist, and therapist. She saw each of them about 1-1/2 months ago. She lives with her mother. She is upset because she does not have her children. Parents foster care. She is trying to do things to get her children back.  She's not had any recent illnesses. There are no other modifying factors.  Patient is a 22 y.o. female presenting with Overdose. The history is provided by the patient.  Drug Overdose    Past Medical History  Diagnosis Date  . Postpartum depression     states "yes" but no treatment  . Chlamydia 2006    treated  . Candidiasis 2010    treatment  . Cystitis   . Hypertension   . Reflux   . Seizures     Last seizure in December    Past Surgical History  Procedure Date  . Dilation and curettage of uterus     Family History  Problem Relation Age of Onset  . Heart disease Maternal Grandmother   . Thrombophlebitis Maternal Grandmother   . Diabetes Maternal Grandmother   . Anemia Mother   . Sickle cell  trait Daughter     History  Substance Use Topics  . Smoking status: Current Every Day Smoker -- 0.2 packs/day for 7 years    Types: Cigarettes  . Smokeless tobacco: Not on file  . Alcohol Use: Yes     Comment: Pt states she has alcohol approx 3 times a week.    OB History    Grav Para Term Preterm Abortions TAB SAB Ect Mult Living   5 3 3  0 1 1 0 0 0 3      Review of Systems  All other systems reviewed and are negative.    Allergies  Nickel  Home Medications   Current Outpatient Rx  Name  Route  Sig  Dispense  Refill  . HYDROXYZINE PAMOATE 50 MG PO CAPS   Oral   Take 50 mg by mouth 2 (two) times daily as needed. For anxiety         . LISINOPRIL PO   Oral   Take 1 tablet by mouth every evening.         Marland Kitchen PROMETHAZINE HCL 25 MG PO TABS   Oral   Take 1 tablet (25 mg total) by mouth every 6 (six) hours as needed for nausea.   30 tablet   0   . SERTRALINE HCL 100 MG PO TABS   Oral   Take 100  mg by mouth every morning.           BP 118/64  Pulse 84  Temp 98.1 F (36.7 C) (Oral)  Resp 18  SpO2 96%  Breastfeeding? Unknown  Physical Exam  Nursing note and vitals reviewed. Constitutional: She is oriented to person, place, and time. She appears well-developed and well-nourished.  HENT:  Head: Normocephalic and atraumatic.  Eyes: Conjunctivae normal and EOM are normal. Pupils are equal, round, and reactive to light.  Neck: Normal range of motion and phonation normal. Neck supple.  Cardiovascular: Normal rate, regular rhythm and intact distal pulses.   Pulmonary/Chest: Effort normal and breath sounds normal. She exhibits no tenderness.  Abdominal: Soft. She exhibits no distension. There is no tenderness. There is no guarding.  Musculoskeletal: Normal range of motion.  Neurological: She is alert and oriented to person, place, and time. She has normal strength. She exhibits normal muscle tone.  Skin: Skin is warm and dry.  Psychiatric: She has a normal  mood and affect. Her behavior is normal. Judgment and thought content normal.    ED Course  Procedures (including critical care time)  The case was discussed with ACT, who saw the patient as well. [place], agrees, that the patient can be managed in the outpatient setting. She will have the patient sign a no harm contract.  Trended vital signs are normal. She does not show any ongoing signs of drug intoxication.       Labs Reviewed  CBC - Abnormal; Notable for the following:    WBC 11.2 (*)     All other components within normal limits  COMPREHENSIVE METABOLIC PANEL - Abnormal; Notable for the following:    BUN 5 (*)     All other components within normal limits  ETHANOL - Abnormal; Notable for the following:    Alcohol, Ethyl (B) 131 (*)     All other components within normal limits  URINE RAPID DRUG SCREEN (HOSP PERFORMED)    Nursing notes, applicable records and vitals reviewed.  Radiologic Images/Reports reviewed.   1. Depression   2. Alcohol intoxication   3. Medication overdose       MDM  Depression and situational anxiety;  leading to alcohol abuse, alcohol intoxication, and medication overdosage. The patient is improved, not suicidal, and can be managed as an outpatient. Doubt toxic or sustained effect from the medications that she ingested. Doubt metabolic instability, serious bacterial infection or impending vascular collapse; the patient is stable for discharge.     Plan: Home Medications- usual, recommend that she contact her prescribing psychiatrist to get refills on her medication; Home Treatments- rest; Recommended follow up- contact psychiatry, as soon as possible. See a therapist as soon as possible        Flint Melter, MD 07/01/12 [phone]

## 2012-07-01 NOTE — BHH Counselor (Signed)
Writer assisted Dr. Effie Shy (EDP) with rescinding patients IVC paperwork. Writer placed completed IVC in patients chart.

## 2012-07-01 NOTE — Accreditation Note (Signed)
Notified Dr. Effie Shy that patients H&P was incomplete. Dr. Effie Shy stated that he would go speak with patient so that he could complete the H&P. Following his face to face interation with the patient Dr. Effie Shy stated that patient denies SI. Dr. Effie Shy sts that if patient signs a no harm contract she will be appropriate for discharge.  Writer will meet with patient to insure that she feels comfortable following up with her current providers: [employer] of the [place]. Writer will also have patient sign a no contract form and place in in chart.

## 2012-07-01 NOTE — ED Notes (Signed)
ZOX:[id number]<AV> Expected date:07/01/12<BR> Expected time: 3:40 AM<BR> Means of arrival:Ambulance<BR> Comments:<BR> overdose

## 2012-07-01 NOTE — ED Notes (Signed)
Pt speaking with mother on phone.

## 2012-07-01 NOTE — ED Notes (Signed)
Poison Control said to be aware of CNS depression, tachycardic, hypotension, possible seizures and hallucinations.

## 2012-07-01 NOTE — ED Notes (Signed)
Pt took hydroxyzine and zoloft, approx, 10-15 of each, her mother states it was to kill herself, but pt didn't admit that. Pt was uncooperative at first for EMS and was handcuffed to the stretcher, pt calmer upon arrival to the ED. Pt alert and oriented
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
## 2012-07-01 NOTE — ED Provider Notes (Signed)
History     CSN: [id number]  Arrival date & time 07/01/12  0400   First MD Initiated Contact with Patient 07/01/12 [phone]      Chief Complaint  Patient presents with  . Drug Overdose   Level V caveat due to patient's altered mental status secondary to ethanol ingestion (Consider location/radiation/quality/duration/timing/severity/associated sxs/prior treatment) HPIAlicia Judie Petit Tran is a 22 y.o. female who presents via EMS from home after drinking "a lot" of alcohol this evening and taking approximately 10-15 pills of hydroxyzine and Zoloft earlier this evening. According to her mother, she took this and apparent suicidal gesture. Patient currently denies any suicidal or homicidal ideation. Patient denies any delusional thoughts or hallucinations. On patient's arrival to the emergency department she was uncooperative, agitated and was restrained for a short period of time. Patient did vomit, and became calmer and fell asleep.  Patient is not complaining about any pain.   Past Medical History  Diagnosis Date  . Postpartum depression     states "yes" but no treatment  . Chlamydia 2006    treated  . Candidiasis 2010    treatment  . Cystitis   . Hypertension   . Reflux   . Seizures     Last seizure in December    Past Surgical History  Procedure Date  . Dilation and curettage of uterus     Family History  Problem Relation Age of Onset  . Heart disease Maternal Grandmother   . Thrombophlebitis Maternal Grandmother   . Diabetes Maternal Grandmother   . Anemia Mother   . Sickle cell trait Daughter     History  Substance Use Topics  . Smoking status: Current Every Day Smoker -- 0.2 packs/day for 7 years    Types: Cigarettes  . Smokeless tobacco: Not on file  . Alcohol Use: Yes     Comment: Pt states she has alcohol approx 3 times a week.    OB History    Grav Para Term Preterm Abortions TAB SAB Ect Mult Living   5 3 3  0 1 1 0 0 0 3      Review of Systems Level V  caveat due to patient's altered mental status secondary to ethanol ingestion Allergies  Nickel  Home Medications   Current Outpatient Rx  Name  Route  Sig  Dispense  Refill  . HYDROXYZINE PAMOATE 50 MG PO CAPS   Oral   Take 50 mg by mouth 2 (two) times daily as needed. For anxiety         . LISINOPRIL PO   Oral   Take 1 tablet by mouth every evening.         Marland Kitchen PROMETHAZINE HCL 25 MG PO TABS   Oral   Take 1 tablet (25 mg total) by mouth every 6 (six) hours as needed for nausea.   30 tablet   0   . SERTRALINE HCL 100 MG PO TABS   Oral   Take 100 mg by mouth every morning.           BP 118/64  Pulse 84  Temp 98.1 F (36.7 C) (Oral)  Resp 18  SpO2 96%  Breastfeeding? Unknown  Physical Exam  Nursing notes reviewed.  Electronic medical record reviewed. VITAL SIGNS:   Filed Vitals:   07/01/12 0518 07/01/12 0722 07/01/12 1525  BP: 149/100 118/64 115/76  Pulse: 85 84 75  Temp: 97.8 F (36.6 C) 98.1 F (36.7 C)   TempSrc: Oral Oral   Resp:  18 18 16   SpO2: 100% 96% 98%   CONSTITUTIONAL: Awake, oriented, somnolent  HENT: Atraumatic, normocephalic, oral mucosa pink and moist, airway patent. Nares patent without drainage. External ears normal. EYES: Conjunctiva clear, EOMI, PERRLA NECK: Trachea midline, non-tender, supple CARDIOVASCULAR: Normal heart rate, Normal rhythm, No murmurs, rubs, gallops PULMONARY/CHEST: Clear to auscultation, no rhonchi, wheezes, or rales. Symmetrical breath sounds. Non-tender. ABDOMINAL: Non-distended, soft, non-tender - no rebound or guarding.  BS normal. NEUROLOGIC: Non-focal, moving all four extremities, no gross sensory or motor deficits. EXTREMITIES: No clubbing, cyanosis, or edema SKIN: Warm, Dry, No erythema, No rash  ED Course  Procedures (including critical care time)  Labs Reviewed  CBC - Abnormal; Notable for the following:    WBC 11.2 (*)     All other components within normal limits  COMPREHENSIVE METABOLIC PANEL -  Abnormal; Notable for the following:    BUN 5 (*)     All other components within normal limits  ETHANOL - Abnormal; Notable for the following:    Alcohol, Ethyl (B) 131 (*)     All other components within normal limits   No results found.   1. Depression   2. Alcohol intoxication   3. Medication overdose       MDM  Shirley Tran is a 22 y.o. female presents with alcohol intoxication as well as ingestion of hydroxyzine and Zoloft. At these dosages, do not think any emergent interventions need to be done, patient's ingestion was over an hour ago precluding use of charcoal, and the patient has subsequently vomited frequently.  Patient is somnolent but maintained her airway-she does not require intubation. She does not appear overly anti-cholinergic either I do not think she requires any medication such as benzodiazepines.  Obtained labs showing patient's alcohol level is 131 which is lower than to be expected with her somnolence, take the Atarax and Zoloft are likely causing altered sensorium. Patient to be evaluated later today for suicidal or homicidal ideations and disposition         Jones Skene, MD 07/02/12 [phone]

## 2012-07-01 NOTE — BHH Counselor (Signed)
Patient signed the no harm contract and agreed to follow up with current providers.

## 2012-09-09 ENCOUNTER — Emergency Department (HOSPITAL_COMMUNITY): Payer: Medicaid Other

## 2012-09-09 ENCOUNTER — Encounter (HOSPITAL_COMMUNITY): Payer: Self-pay | Admitting: *Deleted

## 2012-09-09 ENCOUNTER — Emergency Department (HOSPITAL_COMMUNITY)
Admission: EM | Admit: 2012-09-09 | Discharge: 2012-09-10 | Disposition: A | Discharge status: Home / Self Care | Payer: Medicaid Other | Attending: Emergency Medicine | Admitting: Emergency Medicine

## 2012-09-09 DIAGNOSIS — Y92410 Unspecified street and highway as the place of occurrence of the external cause: Secondary | ICD-10-CM | POA: Insufficient documentation

## 2012-09-09 DIAGNOSIS — Y9389 Activity, other specified: Secondary | ICD-10-CM | POA: Insufficient documentation

## 2012-09-09 DIAGNOSIS — Z8719 Personal history of other diseases of the digestive system: Secondary | ICD-10-CM | POA: Insufficient documentation

## 2012-09-09 DIAGNOSIS — IMO0002 Reserved for concepts with insufficient information to code with codable children: Secondary | ICD-10-CM | POA: Insufficient documentation

## 2012-09-09 DIAGNOSIS — I10 Essential (primary) hypertension: Secondary | ICD-10-CM | POA: Insufficient documentation

## 2012-09-09 DIAGNOSIS — Z8669 Personal history of other diseases of the nervous system and sense organs: Secondary | ICD-10-CM | POA: Insufficient documentation

## 2012-09-09 DIAGNOSIS — Z79899 Other long term (current) drug therapy: Secondary | ICD-10-CM | POA: Insufficient documentation

## 2012-09-09 DIAGNOSIS — Z8619 Personal history of other infectious and parasitic diseases: Secondary | ICD-10-CM | POA: Insufficient documentation

## 2012-09-09 DIAGNOSIS — F17200 Nicotine dependence, unspecified, uncomplicated: Secondary | ICD-10-CM | POA: Insufficient documentation
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
## 2012-09-10 MED ORDER — TRAMADOL HCL 50 MG PO TABS
50.0000 mg | ORAL_TABLET | Freq: Once | ORAL | Status: AC
  Administered 2012-09-10: 50 mg via ORAL
  Filled 2012-09-10: qty 1

## 2012-09-10 MED ORDER — METHOCARBAMOL 500 MG PO TABS: 500.0000 mg | ORAL_TABLET | Freq: Two times a day (BID) | ORAL | Status: DC

## 2012-09-10 MED ORDER — HYDROCODONE-ACETAMINOPHEN 5-325 MG PO TABS: 1.0000 | ORAL_TABLET | Freq: Four times a day (QID) | ORAL | Status: DC | PRN

## 2012-09-10 NOTE — ED Provider Notes (Signed)
Medical screening examination/treatment/procedure(s) were performed by non-physician practitioner and as supervising physician I was immediately available for consultation/collaboration.   Nelia Shi, MD 09/10/12 [phone]

## 2012-12-09 ENCOUNTER — Inpatient Hospital Stay (HOSPITAL_COMMUNITY): Payer: Self-pay

## 2012-12-09 ENCOUNTER — Encounter (HOSPITAL_COMMUNITY): Payer: Self-pay

## 2012-12-09 ENCOUNTER — Inpatient Hospital Stay (HOSPITAL_COMMUNITY)
Admission: AD | Admit: 2012-12-09 | Discharge: 2012-12-09 | Disposition: A | Discharge status: Home / Self Care | Payer: Self-pay | Source: Ambulatory Visit | Attending: Family Medicine | Admitting: Family Medicine

## 2012-12-09 DIAGNOSIS — N72 Inflammatory disease of cervix uteri: Secondary | ICD-10-CM | POA: Insufficient documentation

## 2012-12-09 DIAGNOSIS — O219 Vomiting of pregnancy, unspecified: Secondary | ICD-10-CM

## 2012-12-09 DIAGNOSIS — O2390 Unspecified genitourinary tract infection in pregnancy, unspecified trimester: Secondary | ICD-10-CM | POA: Insufficient documentation

## 2012-12-09 DIAGNOSIS — R109 Unspecified abdominal pain: Secondary | ICD-10-CM

## 2012-12-09 DIAGNOSIS — B9689 Other specified bacterial agents as the cause of diseases classified elsewhere: Secondary | ICD-10-CM

## 2012-12-09 DIAGNOSIS — O26899 Other specified pregnancy related conditions, unspecified trimester: Secondary | ICD-10-CM

## 2012-12-09 DIAGNOSIS — O210 Mild hyperemesis gravidarum: Secondary | ICD-10-CM | POA: Insufficient documentation

## 2012-12-09 LAB — CBC
MCV: 95.8 fL (ref 78.0–100.0)
Platelets: 278 10*3/uL (ref 150–400)
RBC: 3.79 MIL/uL — ABNORMAL LOW (ref 3.87–5.11)
RDW: 12.9 % (ref 11.5–15.5)
WBC: 9 10*3/uL (ref 4.0–10.5)

## 2012-12-09 LAB — WET PREP, GENITAL
Trich, Wet Prep: NONE SEEN
Yeast Wet Prep HPF POC: NONE SEEN

## 2012-12-09 LAB — URINALYSIS, ROUTINE W REFLEX MICROSCOPIC
Bilirubin Urine: NEGATIVE
Nitrite: NEGATIVE
Specific Gravity, Urine: 1.02 (ref 1.005–1.030)
Urobilinogen, UA: 1 mg/dL (ref 0.0–1.0)
pH: 6 (ref 5.0–8.0)

## 2012-12-09 LAB — HCG, QUANTITATIVE, PREGNANCY: hCG, Beta Chain, Quant, S: 61736 m[IU]/mL — ABNORMAL HIGH (ref <5)

## 2012-12-09 LAB — POCT PREGNANCY, URINE: Preg Test, Ur: POSITIVE — AB

## 2012-12-09 MED ORDER — AZITHROMYCIN 250 MG PO TABS
1000.0000 mg | ORAL_TABLET | Freq: Once | ORAL | Status: AC
  Administered 2012-12-09: 1000 mg via ORAL
  Filled 2012-12-09: qty 4

## 2012-12-09 MED ORDER — ONDANSETRON 8 MG PO TBDP
8.0000 mg | ORAL_TABLET | Freq: Once | ORAL | Status: AC
  Administered 2012-12-09: 8 mg via ORAL
  Filled 2012-12-09: qty 1

## 2012-12-09 MED ORDER — METRONIDAZOLE 500 MG PO TABS: 500.0000 mg | ORAL_TABLET | Freq: Two times a day (BID) | ORAL | Status: DC

## 2012-12-09 MED ORDER — PROMETHAZINE HCL 25 MG PO TABS: 25.0000 mg | ORAL_TABLET | Freq: Four times a day (QID) | ORAL | Status: DC | PRN

## 2012-12-09 MED ORDER — CEFTRIAXONE SODIUM 250 MG IJ SOLR
250.0000 mg | Freq: Once | INTRAMUSCULAR | Status: AC
  Administered 2012-12-09: 250 mg via INTRAMUSCULAR
  Filled 2012-12-09: qty 250

## 2012-12-09 NOTE — MAU Note (Signed)
Patient states she has been having abdominal today. Thinks she might be pregnancy. Had vomiting last night.

## 2012-12-09 NOTE — MAU Provider Note (Signed)
History     CSN: [id number]  Arrival date and time: 12/09/12 1622   None     Chief Complaint  Patient presents with  . Possible Pregnancy  . Abdominal Pain   HPI Pt is [redacted]w[redacted]d pregnant and presents with diffuse lower abdominal pain that comes and goes.  Pt had a craving for fat back this morning and thought she might be pregnant.  Pt denies spotting or bleeding since LMP 10/29/2012.  Pt is unsure if she has been exposed to STD.   RN note: Patient states she has been having abdominal today. Thinks she might be pregnancy. Had vomiting last night  Past Medical History  Diagnosis Date  . Postpartum depression     states "yes" but no treatment  . Chlamydia 2006    treated  . Candidiasis 2010    treatment  . Cystitis   . Hypertension   . Reflux   . Seizures     Last seizure in December    Past Surgical History  Procedure Laterality Date  . Dilation and curettage of uterus      Family History  Problem Relation Age of Onset  . Heart disease Maternal Grandmother   . Thrombophlebitis Maternal Grandmother   . Diabetes Maternal Grandmother   . Anemia Mother   . Sickle cell trait Daughter     History  Substance Use Topics  . Smoking status: Current Every Day Smoker -- 0.25 packs/day for 7 years    Types: Cigarettes  . Smokeless tobacco: Not on file  . Alcohol Use: Yes     Comment: Pt states she has alcohol approx 3 times a week.    Allergies:  Allergies  Allergen Reactions  . Nickel Rash    Prescriptions prior to admission  Medication Sig Dispense Refill  . cyclobenzaprine (FLEXERIL) 5 MG tablet Take 5 mg by mouth 3 (three) times daily as needed for muscle spasms.      Marland Kitchen HYDROcodone-acetaminophen (NORCO/VICODIN) 5-325 MG per tablet Take 1 tablet by mouth every 6 (six) hours as needed for pain.  20 tablet  0  . hydrOXYzine (VISTARIL) 50 MG capsule Take 50 mg by mouth 2 (two) times daily as needed. For anxiety      . methocarbamol (ROBAXIN) 500 MG tablet Take 1  tablet (500 mg total) by mouth 2 (two) times daily.  20 tablet  0  . promethazine (PHENERGAN) 25 MG tablet Take 1 tablet (25 mg total) by mouth every 6 (six) hours as needed for nausea.  30 tablet  0  . sertraline (ZOLOFT) 100 MG tablet Take 100 mg by mouth every morning.        Review of Systems  Gastrointestinal: Positive for nausea, vomiting and abdominal pain. Negative for diarrhea and constipation.  Genitourinary: Negative for dysuria and urgency.   Physical Exam   Blood pressure 122/72, pulse 82, temperature 98.3 F (36.8 C), temperature source Oral, resp. rate 16, height 5' 3.5" (1.613 m), weight 92.987 kg (205 lb), last menstrual period 10/29/2012, SpO2 100.00%.  Physical Exam  Nursing note and vitals reviewed. Constitutional: She is oriented to person, place, and time. She appears well-developed and well-nourished. No distress.  HENT:  Head: Normocephalic.  Eyes: Pupils are equal, round, and reactive to light.  Neck: Normal range of motion. Neck supple.  Cardiovascular: Normal rate.   Respiratory: Effort normal.  GI: Soft. She exhibits no distension. There is no tenderness. There is no rebound.  Genitourinary:  Vagina- small amount of watery  white discharge in vault; cervix clean but tender with cervical motion; uterus 10 weeks size and mildly tender; adnexa without palpable enlargement or appreciable tenderness  Musculoskeletal: Normal range of motion.  Neurological: She is alert and oriented to person, place, and time.  Skin: Skin is warm and dry.  Psychiatric: She has a normal mood and affect.    MAU Course  Procedures Results for orders placed during the hospital encounter of 12/09/12 (from the past 24 hour(s))  URINALYSIS, ROUTINE W REFLEX MICROSCOPIC     Status: None   Collection Time    12/09/12  5:00 PM      Result Value Range   Color, Urine YELLOW  YELLOW   APPearance CLEAR  CLEAR   Specific Gravity, Urine 1.020  1.005 - 1.030   pH 6.0  5.0 - 8.0    Glucose, UA NEGATIVE  NEGATIVE mg/dL   Hgb urine dipstick NEGATIVE  NEGATIVE   Bilirubin Urine NEGATIVE  NEGATIVE   Ketones, ur NEGATIVE  NEGATIVE mg/dL   Protein, ur NEGATIVE  NEGATIVE mg/dL   Urobilinogen, UA 1.0  0.0 - 1.0 mg/dL   Nitrite NEGATIVE  NEGATIVE   Leukocytes, UA NEGATIVE  NEGATIVE  POCT PREGNANCY, URINE     Status: Abnormal   Collection Time    12/09/12  5:05 PM      Result Value Range   Preg Test, Ur POSITIVE (*) NEGATIVE  CBC     Status: Abnormal   Collection Time    12/09/12  6:05 PM      Result Value Range   WBC 9.0  4.0 - 10.5 K/uL   RBC 3.79 (*) 3.87 - 5.11 MIL/uL   Hemoglobin 12.3  12.0 - 15.0 g/dL   HCT 82.9  56.2 - 13.0 %   MCV 95.8  78.0 - 100.0 fL   MCH 32.5  26.0 - 34.0 pg   MCHC 33.9  30.0 - 36.0 g/dL   RDW 86.5  78.4 - 69.6 %   Platelets 278  150 - 400 K/uL  HCG, QUANTITATIVE, PREGNANCY     Status: Abnormal   Collection Time    12/09/12  6:05 PM      Result Value Range   hCG, Beta Chain, Quant, S 29528 (*) <5 mIU/mL   US Ob Comp Less 14 Wks  12/09/2012   *RADIOLOGY REPORT*  Clinical Data: Pelvic pain.  OBSTETRIC <14 WK ULTRASOUND  Technique:  Transabdominal ultrasound was performed for evaluation of the gestation as well as the maternal uterus and adnexal regions.  Comparison:  None.  Intrauterine gestational sac: Visualized/normal in shape. Yolk sac: Present Embryo: Present Cardiac Activity: Present Heart Rate: 165 bpm  MSD:  mm   w   d CRL:  33.3 mm  10 w  2 d            [place] EDC: 07/05/2013  Maternal uterus/Adnexae: No subchorionic hemorrhage. Normal ovaries. No free pelvic fluid.  IMPRESSION: Single living intrauterine fetus estimated at 10 weeks and 2 days gestation. No subchorionic hemorrhage. Normal ovaries.   Original Report Authenticated By: Rudie Meyer, M.D.  will treat for cervicitis Assessment and Plan  Abdominal pain in pregnancy Cervicitis  Pt to go to [facility] OB clinic Nausea and vomiting in pregnancy- prescription for  phenergan  Nicolaas Savo 12/09/2012, 5:42 PM

## 2012-12-09 NOTE — MAU Provider Note (Signed)
Chart reviewed and agree with management and plan.  

## 2012-12-10 LAB — GC/CHLAMYDIA PROBE AMP: GC Probe RNA: NEGATIVE

## 2012-12-25 ENCOUNTER — Encounter: Payer: Self-pay | Admitting: Advanced Practice Midwife

## 2012-12-25 ENCOUNTER — Ambulatory Visit (INDEPENDENT_AMBULATORY_CARE_PROVIDER_SITE_OTHER): Payer: Self-pay | Admitting: Advanced Practice Midwife

## 2012-12-25 VITALS — BP 129/85 | Temp 96.8°F | Wt 201.2 lb

## 2012-12-25 DIAGNOSIS — O10011 Pre-existing essential hypertension complicating pregnancy, first trimester: Secondary | ICD-10-CM

## 2012-12-25 DIAGNOSIS — O09219 Supervision of pregnancy with history of pre-term labor, unspecified trimester: Secondary | ICD-10-CM

## 2012-12-25 DIAGNOSIS — O0990 Supervision of high risk pregnancy, unspecified, unspecified trimester: Secondary | ICD-10-CM

## 2012-12-25 DIAGNOSIS — O09899 Supervision of other high risk pregnancies, unspecified trimester: Secondary | ICD-10-CM | POA: Insufficient documentation

## 2012-12-25 DIAGNOSIS — O10019 Pre-existing essential hypertension complicating pregnancy, unspecified trimester: Secondary | ICD-10-CM | POA: Insufficient documentation

## 2012-12-25 LAB — POCT URINALYSIS DIP (DEVICE)
Bilirubin Urine: NEGATIVE
Glucose, UA: NEGATIVE mg/dL
Hgb urine dipstick: NEGATIVE
Ketones, ur: NEGATIVE mg/dL
Nitrite: NEGATIVE
Specific Gravity, Urine: 1.01 (ref 1.005–1.030)
pH: 7 (ref 5.0–8.0)

## 2012-12-25 MED ORDER — PRENATAL VITAMINS 28-0.8 MG PO TABS: 1.0000 | ORAL_TABLET | Freq: Every day | ORAL | Status: DC

## 2012-12-25 NOTE — Progress Notes (Signed)
P=79,  Here for initial ob.  C/o pelvic pressure, States was supposed to take bp med, but hasn't been  because didn't have doctor or insurance, now applied for medicaid. Given new patient information. Discussed bmi and appropriate weight gain.. States morning sickness is " way better"

## 2012-12-25 NOTE — Progress Notes (Signed)
Subjective:    Shirley Tran is a [id number] [redacted]w[redacted]d being seen today for her first obstetrical visit.  Her obstetrical history is significant for 3 prior NSVDs, 2 full term, and her last delivery at 36 weeks.  She also has CHTN since her first pregnancy, but is not taking any medications currently.. Patient does intend to breast feed. Pregnancy history fully reviewed.  Patient reports no complaints.  Filed Vitals:   12/25/12 0829  BP: 129/85  Temp: 96.8 F (36 C)  Weight: 91.264 kg (201 lb 3.2 oz)    HISTORY: OB History   Grav Para Term Preterm Abortions TAB SAB Ect Mult Living   6 3 2 1 2 1 1  0 0 3     # Outc Date GA Lbr Len/2nd Wgt Sex Del Anes PTL Lv   1 TAB 2006           2 TRM 3/09 [redacted]w[redacted]d  4.540JW(1XB1YN) F SVD EPI  Yes   Comments: pregnancy induced hypertension- was on medication- born at [facility]   3 TRM 9/10 [redacted]w[redacted]d  2.268kg(5lb) F SVD EPI  Yes   Comments: chtn, born at [facility],    4 PRE 1/12 [redacted]w[redacted]d   F SVD EPI  Yes   Comments: chtn, states bled a lot afterwards, born at [facility]   5 SAB 12/13           Comments: states no baby, just sac- here at [facility]   6 CUR              Past Medical History  Diagnosis Date  . Chlamydia 2006    treated  . Candidiasis 2010    treatment  . Cystitis   . Reflux   . Seizures 2012    only had 1 seizure  . Hypertension     out of meds, no PCP  . Complication of anesthesia     back pain  . Postpartum depression     states depression all the time, worse postpartum   Past Surgical History  Procedure Laterality Date  . Dilation and curettage of uterus     Family History  Problem Relation Age of Onset  . Heart disease Maternal Grandmother   . Thrombophlebitis Maternal Grandmother   . Diabetes Maternal Grandmother   . Anemia Mother   . Sickle cell trait Daughter      Exam    Uterus:     Pelvic Exam:    Perineum: No Hemorrhoids, Normal Perineum   Vulva: normal   Vagina:  normal mucosa, normal discharge   pH:    Cervix:  multiparous appearance, no bleeding following Pap, no cervical motion tenderness and no lesions   Adnexa: normal adnexa and no mass, fullness, tenderness   Bony Pelvis: average  System: Breast:  normal appearance, no masses or tenderness   Skin: normal coloration and turgor, no rashes    Neurologic: oriented, normal   Extremities: normal strength, tone, and muscle mass   HEENT neck supple with midline trachea and thyroid without masses   Mouth/Teeth mucous membranes moist, pharynx normal without lesions   Neck supple and no masses   Cardiovascular: regular rate and rhythm   Respiratory:  appears well, vitals normal, no respiratory distress, acyanotic, normal RR, ear and throat exam is normal, neck free of mass or lymphadenopathy, chest clear, no wheezing, crepitations, rhonchi, normal symmetric air entry   Abdomen: soft, non-tender; bowel sounds normal; no masses,  no organomegaly   Urinary: urethral meatus normal  Assessment:    Pregnancy: [id number] Patient Active Problem List   Diagnosis Date Noted  . Supervision of high-risk pregnancy 12/25/2012        Plan:     Initial labs drawn. Prenatal vitamins. Problem list reviewed and updated. Genetic Screening discussed First Screen and Integrated Screen: declined.  Ultrasound discussed; fetal survey: requested.  Follow up in 4 weeks. 50% of 30 min visit spent on counseling and coordination of care.     [place], [place] Scheiderer 12/25/2012

## 2012-12-25 NOTE — Addendum Note (Signed)
Addended by: Jill Side on: 12/25/2012 11:06 AM   Modules accepted: Orders

## 2012-12-26 LAB — OBSTETRIC PANEL
Antibody Screen: NEGATIVE
Basophils Relative: 0 % (ref 0–1)
Eosinophils Absolute: 0.2 10*3/uL (ref 0.0–0.7)
HCT: 35.5 % — ABNORMAL LOW (ref 36.0–46.0)
Hemoglobin: 12.1 g/dL (ref 12.0–15.0)
Hepatitis B Surface Ag: NEGATIVE
Lymphs Abs: 2.1 10*3/uL (ref 0.7–4.0)
MCH: 32.2 pg (ref 26.0–34.0)
MCHC: 34.1 g/dL (ref 30.0–36.0)
MCV: 94.4 fL (ref 78.0–100.0)
Monocytes Absolute: 0.4 10*3/uL (ref 0.1–1.0)
Monocytes Relative: 5 % (ref 3–12)
Neutrophils Relative %: 63 % (ref 43–77)
RBC: 3.76 MIL/uL — ABNORMAL LOW (ref 3.87–5.11)
Rh Type: POSITIVE

## 2012-12-26 LAB — CULTURE, OB URINE: Colony Count: 30000

## 2012-12-26 NOTE — Addendum Note (Signed)
Addended by: Darrel Hoover on: 12/26/2012 08:54 AM   Modules accepted: Orders

## 2012-12-29 LAB — HEMOGLOBINOPATHY EVALUATION
Hemoglobin Other: 0 %
Hgb A: 96.6 % — ABNORMAL LOW (ref 96.8–97.8)

## 2013-01-22 ENCOUNTER — Encounter: Payer: Self-pay | Admitting: Advanced Practice Midwife

## 2013-01-22 ENCOUNTER — Ambulatory Visit (INDEPENDENT_AMBULATORY_CARE_PROVIDER_SITE_OTHER): Payer: Self-pay | Admitting: Advanced Practice Midwife

## 2013-01-22 VITALS — BP 129/82 | Temp 97.8°F | Wt 198.1 lb

## 2013-01-22 DIAGNOSIS — Z23 Encounter for immunization: Secondary | ICD-10-CM

## 2013-01-22 DIAGNOSIS — F1210 Cannabis abuse, uncomplicated: Secondary | ICD-10-CM

## 2013-01-22 DIAGNOSIS — O09219 Supervision of pregnancy with history of pre-term labor, unspecified trimester: Secondary | ICD-10-CM

## 2013-01-22 DIAGNOSIS — O0992 Supervision of high risk pregnancy, unspecified, second trimester: Secondary | ICD-10-CM

## 2013-01-22 DIAGNOSIS — F1290 Cannabis use, unspecified, uncomplicated: Secondary | ICD-10-CM | POA: Insufficient documentation

## 2013-01-22 LAB — POCT URINALYSIS DIP (DEVICE)
Glucose, UA: NEGATIVE mg/dL
Hgb urine dipstick: NEGATIVE
Nitrite: NEGATIVE
Specific Gravity, Urine: 1.01 (ref 1.005–1.030)
pH: 6.5 (ref 5.0–8.0)

## 2013-01-22 MED ORDER — HYDROXYPROGESTERONE CAPROATE 250 MG/ML IM OIL: 250.0000 mg | TOPICAL_OIL | INTRAMUSCULAR | Status: DC

## 2013-01-22 NOTE — Progress Notes (Signed)
Pulse: 92

## 2013-01-22 NOTE — Progress Notes (Signed)
Doing well. No headache or visual changes. No edema. BP good today. Discussed we may need to add meds later on potentially. Saw nutrition and SW today. Will get UDS and Quad Screen today. Wants to do 17P shots, will apply today and start next week. Wants TDAP and Flu vaccine.  Will schedule anatomy US for two weeks from now

## 2013-01-22 NOTE — Progress Notes (Signed)
Nutrition note: consult for wt loss & 1st nutr visit Pt has h/o obesity. Pt has lost 12.9# @ [redacted]w[redacted]d. Pt stated she had a lot of N/V in the beginning, which has decreased but pt stopped smoking marijuana 01/14/13 and stated that she has no appetite now and nothing sounds good. Pt reports usually eating 2 meals & 3-4 snacks/d but lately has been only eating 1x/d. Pt is taking PNV. Pt reports some heartburn and nausea. Pt received verbal & written education on general nutrition during pregnancy. Also discussed "Gain more wt during pregnancy" and "Small frequent meals" handouts. Discussed tips to decrease nausea and heartburn.  Discussed wt gain goals of 11-20# or 0.5#/wk. Pt agrees to try to eat small amounts more often and continue taking PNV. Pt does not have WIC but plans to apply. Pt plans to BF. F/u in 4-6 wks Blondell Reveal, MS, RD, LDN

## 2013-01-22 NOTE — Patient Instructions (Addendum)
Pregnancy - Second Trimester The second trimester is the period between 13 to 27 weeks of your pregnancy. It is important to follow your doctor's instructions. HOME CARE   Do not smoke.  Do not drink alcohol or use drugs.  Only take medicine as told by your doctor.  Take prenatal vitamins as told. The vitamin should contain 1 milligram of folic acid.  Exercise.  Eat healthy foods. Eat regular, well-balanced meals.  You can have sex (intercourse) if there are no other problems with the pregnancy.  Do not use hot tubs, steam rooms, or saunas.  Wear a seat belt while driving.  Avoid raw meat, uncooked cheese, and litter boxes and soil used by cats.  Visit your dentist. Cleanings are okay. GET HELP RIGHT AWAY IF:   You have a temperature by mouth above 102 F (38.9 C), not controlled by medicine.  Fluid is coming from your vagina.  Blood is coming from your vagina. Light spotting is common, especially after sex (intercourse).  You have a bad smelling fluid (discharge) coming from the vagina. The fluid changes from clear to white.  You still feel sick to your stomach (nauseous).  You throw up (vomit) blood.  You lose or gain more than 2 pounds (0.9 kilograms) of weight in a week, or as suggested by your doctor.  Your face, hands, feet, or legs get puffy (swell).  You get exposed to German measles and have never had them.  You get exposed to fifth disease or chickenpox.  You have belly (abdominal) pain.  You have a bad headache that will not go away.  You have watery poop (diarrhea), pain when you pee (urinate), or have shortness of breath.  You start to have problems seeing (blurry or double vision).  You fall, are in a car accident, or have any kind of trauma.  There is mental or physical violence at home.  You have any concerns or worries during your pregnancy. MAKE SURE YOU:   Understand these instructions.  Will watch your condition.  Will get help  right away if you are not doing well or get worse. Document Released: 08/08/2009 Document Revised: 08/06/2011 Document Reviewed: 08/08/2009 ExitCare Patient Information 2014 [employer].  

## 2013-01-23 LAB — DRUGS OF ABUSE SCREEN W/O ALC, ROUTINE URINE
Amphetamine Screen, Ur: NEGATIVE
Barbiturate Quant, Ur: NEGATIVE
Cocaine Metabolites: NEGATIVE
Marijuana Metabolite: POSITIVE — AB
Opiate Screen, Urine: NEGATIVE

## 2013-01-24 ENCOUNTER — Encounter: Payer: Self-pay | Admitting: Advanced Practice Midwife

## 2013-01-28 ENCOUNTER — Encounter: Payer: Self-pay | Admitting: *Deleted

## 2013-01-29 ENCOUNTER — Ambulatory Visit: Payer: Self-pay

## 2013-01-30 ENCOUNTER — Ambulatory Visit: Payer: Self-pay

## 2013-01-30 ENCOUNTER — Encounter: Payer: Self-pay | Admitting: *Deleted

## 2013-01-30 DIAGNOSIS — O0990 Supervision of high risk pregnancy, unspecified, unspecified trimester: Secondary | ICD-10-CM

## 2013-02-05 ENCOUNTER — Ambulatory Visit (HOSPITAL_COMMUNITY)
Admission: RE | Admit: 2013-02-05 | Discharge: 2013-02-05 | Disposition: A | Discharge status: Home / Self Care | Payer: Self-pay | Source: Ambulatory Visit | Attending: Advanced Practice Midwife | Admitting: Advanced Practice Midwife

## 2013-02-05 ENCOUNTER — Telehealth: Payer: Self-pay | Admitting: *Deleted

## 2013-02-05 ENCOUNTER — Ambulatory Visit: Payer: Self-pay

## 2013-02-05 ENCOUNTER — Other Ambulatory Visit: Payer: Self-pay | Admitting: Advanced Practice Midwife

## 2013-02-05 DIAGNOSIS — Z1389 Encounter for screening for other disorder: Secondary | ICD-10-CM | POA: Insufficient documentation

## 2013-02-05 DIAGNOSIS — Z363 Encounter for antenatal screening for malformations: Secondary | ICD-10-CM | POA: Insufficient documentation

## 2013-02-05 DIAGNOSIS — O10019 Pre-existing essential hypertension complicating pregnancy, unspecified trimester: Secondary | ICD-10-CM | POA: Insufficient documentation

## 2013-02-05 DIAGNOSIS — O99330 Smoking (tobacco) complicating pregnancy, unspecified trimester: Secondary | ICD-10-CM | POA: Insufficient documentation

## 2013-02-05 DIAGNOSIS — Z8751 Personal history of pre-term labor: Secondary | ICD-10-CM | POA: Insufficient documentation

## 2013-02-05 DIAGNOSIS — O358XX0 Maternal care for other (suspected) fetal abnormality and damage, not applicable or unspecified: Secondary | ICD-10-CM | POA: Insufficient documentation

## 2013-02-05 NOTE — Telephone Encounter (Signed)
Attempted to call patient to inform her that she needs to call the [facility] at [phone] to complete her process to get her Makena. Unable to leave message, phone just rings.

## 2013-02-06 ENCOUNTER — Ambulatory Visit: Payer: Self-pay

## 2013-02-10 NOTE — Telephone Encounter (Signed)
Called patient, no answer- left message stating we are trying to get in touch with you, please call us back at the clinics. Made note in 9/18 appt to see inbasket

## 2013-02-10 NOTE — Telephone Encounter (Signed)
Patient called back in and I stated we had tried to reach her a couple times because she needs to call the [facility] assistance program to complete her process because we cannot get her medicine from them until then. Patient verbalized understanding and stated they had called her a while back and she didn't know why but she has the message saved and she would call them. Patient had no further questions

## 2013-02-12 ENCOUNTER — Encounter: Payer: Self-pay | Admitting: Obstetrics & Gynecology

## 2013-02-13 ENCOUNTER — Telehealth: Payer: Self-pay | Admitting: *Deleted

## 2013-02-13 NOTE — Telephone Encounter (Signed)
Called patient to let her know that we have received her medicine and she needs to schedule an appt to get her first 17P. I left her a message informing her of this. Will retry on Monday to reach her.

## 2013-02-17 NOTE — Telephone Encounter (Signed)
Spoke to patient she will come in on Thursday for her 1st 17p

## 2013-02-18 ENCOUNTER — Ambulatory Visit: Payer: Self-pay

## 2013-02-26 ENCOUNTER — Encounter: Payer: Self-pay | Admitting: Obstetrics & Gynecology

## 2013-03-12 ENCOUNTER — Encounter: Payer: Self-pay | Admitting: Family Medicine

## 2013-03-12 LAB — POCT URINALYSIS DIP (DEVICE)
Glucose, UA: NEGATIVE mg/dL
Leukocytes, UA: NEGATIVE
Specific Gravity, Urine: 1.02 (ref 1.005–1.030)
Urobilinogen, UA: 0.2 mg/dL (ref 0.0–1.0)

## 2013-03-19 ENCOUNTER — Encounter: Payer: Self-pay | Admitting: Obstetrics & Gynecology

## 2013-04-06 ENCOUNTER — Encounter: Payer: Self-pay | Admitting: Family Medicine

## 2013-04-10 ENCOUNTER — Encounter: Payer: Self-pay | Admitting: *Deleted

## 2013-04-14 ENCOUNTER — Encounter: Payer: Self-pay | Admitting: *Deleted

## 2013-05-08 ENCOUNTER — Inpatient Hospital Stay (HOSPITAL_COMMUNITY)
Admission: AD | Admit: 2013-05-08 | Discharge: 2013-05-08 | Disposition: A | Discharge status: Home / Self Care | Payer: Medicaid Other | Source: Ambulatory Visit | Attending: Obstetrics & Gynecology | Admitting: Obstetrics & Gynecology

## 2013-05-08 ENCOUNTER — Encounter (HOSPITAL_COMMUNITY): Payer: Self-pay | Admitting: General Practice

## 2013-05-08 DIAGNOSIS — R630 Anorexia: Secondary | ICD-10-CM | POA: Insufficient documentation

## 2013-05-08 DIAGNOSIS — O9989 Other specified diseases and conditions complicating pregnancy, childbirth and the puerperium: Secondary | ICD-10-CM

## 2013-05-08 DIAGNOSIS — N949 Unspecified condition associated with female genital organs and menstrual cycle: Secondary | ICD-10-CM

## 2013-05-08 DIAGNOSIS — O99891 Other specified diseases and conditions complicating pregnancy: Secondary | ICD-10-CM | POA: Insufficient documentation

## 2013-05-08 DIAGNOSIS — R109 Unspecified abdominal pain: Secondary | ICD-10-CM | POA: Insufficient documentation

## 2013-05-08 DIAGNOSIS — R51 Headache: Secondary | ICD-10-CM | POA: Insufficient documentation

## 2013-05-08 DIAGNOSIS — O26899 Other specified pregnancy related conditions, unspecified trimester: Secondary | ICD-10-CM

## 2013-05-08 LAB — URINALYSIS, ROUTINE W REFLEX MICROSCOPIC
Bilirubin Urine: NEGATIVE
Glucose, UA: NEGATIVE mg/dL
Hgb urine dipstick: NEGATIVE
Ketones, ur: 40 mg/dL — AB
Leukocytes, UA: NEGATIVE
Nitrite: NEGATIVE
Specific Gravity, Urine: 1.015 (ref 1.005–1.030)
Urobilinogen, UA: 0.2 mg/dL (ref 0.0–1.0)
pH: 7 (ref 5.0–8.0)

## 2013-05-08 MED ORDER — ACETAMINOPHEN 500 MG PO TABS
1000.0000 mg | ORAL_TABLET | Freq: Once | ORAL | Status: AC
  Administered 2013-05-08: 1000 mg via ORAL
  Filled 2013-05-08: qty 2

## 2013-05-08 NOTE — MAU Note (Addendum)
I have bad headach and i'm seeing spots. Stomach pain with sharp pain R lower stomach. Smoke weed everyday and none for 2 days so now I can't eat or sleep. Does have some pain both sides lower abd which is worse with movement but is diff than pain RLQ

## 2013-05-08 NOTE — MAU Provider Note (Signed)
History     CSN: [id number]  Arrival date and time: 05/08/13 2116   First Provider Initiated Contact with Patient 05/08/13 2213      Chief Complaint  Patient presents with  . Abdominal Pain  . Headache   HPI Shirley Tran is a 22yo [id number] at 31.5wks who presents for eval of low abd discomfort/pulling with movement, coughing, and sneezing. Denies leaking or bldg. No N/V/D but does report a decreased appetite since stopping MJ use x 2d. She also reports a H/A but no RUQ pain. Her preg was followed by the [facility] until 16wks but then she stopped care- states she was told by our clinic staff that if her MCD paperwork wasn't turned in by a certain date that her MCD wouldn't go through and she couldn't return to clinic.  Her preg care has been remarkable for 1) CHTN, not requiring meds, and her BPs at clinic visits and today are all WNL 2) prev del at 36wks.  OB History   Grav Para Term Preterm Abortions TAB SAB Ect Mult Living   6 3 2 1 2 1 1  0 0 3      Past Medical History  Diagnosis Date  . Chlamydia 2006    treated  . Candidiasis 2010    treatment  . Cystitis   . Reflux   . Seizures 2012    only had 1 seizure  . Hypertension     out of meds, no PCP  . Complication of anesthesia     back pain  . Postpartum depression     states depression all the time, worse postpartum    Past Surgical History  Procedure Laterality Date  . Dilation and curettage of uterus      Family History  Problem Relation Age of Onset  . Heart disease Maternal Grandmother   . Thrombophlebitis Maternal Grandmother   . Diabetes Maternal Grandmother   . Anemia Mother   . Sickle cell trait Daughter     History  Substance Use Topics  . Smoking status: Current Every Day Smoker -- 0.25 packs/day for 7 years    Types: Cigarettes  . Smokeless tobacco: Never Used     Comment: 12/25/12 smokes Black mild-about 1 per day  . Alcohol Use: No    Allergies:  Allergies  Allergen Reactions  . Elidel  [Pimecrolimus] Itching, Swelling and Rash  . Nickel Rash    Facility-administered medications prior to admission  Medication Dose Route Frequency Provider Last Rate Last Dose  . hydroxyprogesterone caproate (DELALUTIN) 250 mg/mL injection 250 mg  250 mg Intramuscular Weekly Aviva Signs, CNM       Prescriptions prior to admission  Medication Sig Dispense Refill  . Prenatal Vit-Min-FA-Fish Oil ([employer] PRENATAL GUMMY PO) Take 1 each by mouth 2 (two) times daily.        ROS Physical Exam   Blood pressure 116/64, pulse 109, temperature 98.7 F (37.1 C), resp. rate 20, height 5\' 3"  (1.6 m), weight 97.523 kg (215 lb), last menstrual period 10/29/2012.  Physical Exam  Constitutional: She is oriented to person, place, and time. She appears well-developed.  HENT:  Head: Normocephalic.  Neck: Normal range of motion.  Cardiovascular:  Slightly tachy at 97-105  Respiratory: Effort normal.  GI:  EFM 140s +accels, no decels, occ mi variables (some tracing of mat HR) No ctx per toco  Genitourinary: Vagina normal.  Cx C/L/high  Musculoskeletal: Normal range of motion.  Neurological: She is alert and oriented to  person, place, and time.  Skin: Skin is warm and dry.  Psychiatric: She has a normal mood and affect. Her behavior is normal. Thought content normal.   Urinalysis    Component Value Date/Time   COLORURINE YELLOW 05/08/2013 2120   APPEARANCEUR CLEAR 05/08/2013 2120   LABSPEC 1.015 05/08/2013 2120   PHURINE 7.0 05/08/2013 2120   GLUCOSEU NEGATIVE 05/08/2013 2120   HGBUR NEGATIVE 05/08/2013 2120   BILIRUBINUR NEGATIVE 05/08/2013 2120   KETONESUR 40* 05/08/2013 2120   PROTEINUR NEGATIVE 05/08/2013 2120   UROBILINOGEN 0.2 05/08/2013 2120   NITRITE NEGATIVE 05/08/2013 2120   LEUKOCYTESUR NEGATIVE 05/08/2013 2120      MAU Course  Procedures  Tylenol 1gm given for H/A: feeling better  Assessment and Plan  IUP at 31.5wks Round lig pain  D/C home Inbox msg to clinic to  reestablish care with pt; include glucola at her next visit Encouraged to not restart THC Comfort measures rev'd UDS pending  Cam Hai 05/08/2013, 10:25 PM

## 2013-05-09 LAB — RAPID URINE DRUG SCREEN, HOSP PERFORMED
Amphetamines: NOT DETECTED
Barbiturates: NOT DETECTED
Benzodiazepines: NOT DETECTED
Cocaine: NOT DETECTED

## 2013-05-19 ENCOUNTER — Ambulatory Visit (INDEPENDENT_AMBULATORY_CARE_PROVIDER_SITE_OTHER): Payer: Medicaid Other | Admitting: Family Medicine

## 2013-05-19 ENCOUNTER — Encounter: Payer: Self-pay | Admitting: Family Medicine

## 2013-05-19 VITALS — BP 98/58 | Temp 97.6°F | Wt 210.8 lb

## 2013-05-19 DIAGNOSIS — Z23 Encounter for immunization: Secondary | ICD-10-CM

## 2013-05-19 DIAGNOSIS — O10019 Pre-existing essential hypertension complicating pregnancy, unspecified trimester: Secondary | ICD-10-CM

## 2013-05-19 DIAGNOSIS — O0993 Supervision of high risk pregnancy, unspecified, third trimester: Secondary | ICD-10-CM

## 2013-05-19 DIAGNOSIS — O10013 Pre-existing essential hypertension complicating pregnancy, third trimester: Secondary | ICD-10-CM

## 2013-05-19 LAB — POCT URINALYSIS DIP (DEVICE)
Ketones, ur: NEGATIVE mg/dL
Leukocytes, UA: NEGATIVE
Protein, ur: NEGATIVE mg/dL
Specific Gravity, Urine: 1.025 (ref 1.005–1.030)
Urobilinogen, UA: 0.2 mg/dL (ref 0.0–1.0)
pH: 6.5 (ref 5.0–8.0)

## 2013-05-19 LAB — CBC
Hemoglobin: 10.5 g/dL — ABNORMAL LOW (ref 12.0–15.0)
MCH: 31.4 pg (ref 26.0–34.0)
MCV: 94 fL (ref 78.0–100.0)
Platelets: 280 10*3/uL (ref 150–400)
RDW: 13.5 % (ref 11.5–15.5)
WBC: 6.8 10*3/uL (ref 4.0–10.5)

## 2013-05-19 MED ORDER — TETANUS-DIPHTH-ACELL PERTUSSIS 5-2.5-18.5 LF-MCG/0.5 IM SUSP: 0.5000 mL | Freq: Once | INTRAMUSCULAR | Status: DC

## 2013-05-19 NOTE — Progress Notes (Signed)
P= 97  Pt c/o of occasional headaches, relieved with rest/tylenol.  Some irregular contractions/lower abd pressure.  Consented to Flu/Tdap vaccine Manual blood pressure 98/58.  Has been going to MAU for care.

## 2013-05-19 NOTE — Progress Notes (Signed)
Needs U/S for growth Did not qualify for Medicaid Needs to begin 2x/wk testing. NST reviewed and reactive. Going to [employer] [facility] [place].--would like to see SW next visit--trying to stop marijuana use. Got flu and TDaP today 28 wk labs today.

## 2013-05-19 NOTE — Patient Instructions (Signed)
Breastfeeding Deciding to breastfeed is one of the best choices you can make for you and your baby. A change in hormones during pregnancy causes your breast tissue to grow and increases the number and size of your milk ducts. These hormones also allow proteins, sugars, and fats from your blood supply to make breast milk in your milk-producing glands. Hormones prevent breast milk from being released before your baby is born as well as prompt milk flow after birth. Once breastfeeding has begun, thoughts of your baby, as well as his or her sucking or crying, can stimulate the release of milk from your milk-producing glands.  BENEFITS OF BREASTFEEDING For Your Baby  Your first milk (colostrum) helps your baby's digestive system function better.   There are antibodies in your milk that help your baby fight off infections.   Your baby has a lower incidence of asthma, allergies, and sudden infant death syndrome.   The nutrients in breast milk are better for your baby than infant formulas and are designed uniquely for your baby's needs.   Breast milk improves your baby's brain development.   Your baby is less likely to develop other conditions, such as childhood obesity, asthma, or type 2 diabetes mellitus.  For You   Breastfeeding helps to create a very special bond between you and your baby.   Breastfeeding is convenient. Breast milk is always available at the correct temperature and costs nothing.   Breastfeeding helps to burn calories and helps you lose the weight gained during pregnancy.   Breastfeeding makes your uterus contract to its prepregnancy size faster and slows bleeding (lochia) after you give birth.   Breastfeeding helps to lower your risk of developing type 2 diabetes mellitus, osteoporosis, and breast or ovarian cancer later in life. SIGNS THAT YOUR BABY IS HUNGRY Early Signs of Hunger  Increased alertness or activity.  Stretching.  Movement of the head from  side to side.  Movement of the head and opening of the mouth when the corner of the mouth or cheek is stroked (rooting).  Increased sucking sounds, smacking lips, cooing, sighing, or squeaking.  Hand-to-mouth movements.  Increased sucking of fingers or hands. Late Signs of Hunger  Fussing.  Intermittent crying. Extreme Signs of Hunger Signs of extreme hunger will require calming and consoling before your baby will be able to breastfeed successfully. Do not wait for the following signs of extreme hunger to occur before you initiate breastfeeding:   Restlessness.  A loud, strong cry.   Screaming. BREASTFEEDING BASICS Breastfeeding Initiation  Find a comfortable place to sit or lie down, with your neck and back well supported.  Place a pillow or rolled up blanket under your baby to bring him or her to the level of your breast (if you are seated). Nursing pillows are specially designed to help support your arms and your baby while you breastfeed.  Make sure that your baby's abdomen is facing your abdomen.   Gently massage your breast. With your fingertips, massage from your chest wall toward your nipple in a circular motion. This encourages milk flow. You may need to continue this action during the feeding if your milk flows slowly.  Support your breast with 4 fingers underneath and your thumb above your nipple. Make sure your fingers are well away from your nipple and your baby's mouth.   Stroke your baby's lips gently with your finger or nipple.   When your baby's mouth is open wide enough, quickly bring your baby to your   breast, placing your entire nipple and as much of the colored area around your nipple (areola) as possible into your baby's mouth.   More areola should be visible above your baby's upper lip than below the lower lip.   Your baby's tongue should be between his or her lower gum and your breast.   Ensure that your baby's mouth is correctly positioned  around your nipple (latched). Your baby's lips should create a seal on your breast and be turned out (everted).  It is common for your baby to suck about 2 3 minutes in order to start the flow of breast milk. Latching Teaching your baby how to latch on to your breast properly is very important. An improper latch can cause nipple pain and decreased milk supply for you and poor weight gain in your baby. Also, if your baby is not latched onto your nipple properly, he or she may swallow some air during feeding. This can make your baby fussy. Burping your baby when you switch breasts during the feeding can help to get rid of the air. However, teaching your baby to latch on properly is still the best way to prevent fussiness from swallowing air while breastfeeding. Signs that your baby has successfully latched on to your nipple:    Silent tugging or silent sucking, without causing you pain.   Swallowing heard between every 3 4 sucks.    Muscle movement above and in front of his or her ears while sucking.  Signs that your baby has not successfully latched on to nipple:   Sucking sounds or smacking sounds from your baby while breastfeeding.  Nipple pain. If you think your baby has not latched on correctly, slip your finger into the corner of your baby's mouth to break the suction and place it between your baby's gums. Attempt breastfeeding initiation again. Signs of Successful Breastfeeding Signs from your baby:   A gradual decrease in the number of sucks or complete cessation of sucking.   Falling asleep.   Relaxation of his or her body.   Retention of a small amount of milk in his or her mouth.   Letting go of your breast by himself or herself. Signs from you:  Breasts that have increased in firmness, weight, and size 1 3 hours after feeding.   Breasts that are softer immediately after breastfeeding.  Increased milk volume, as well as a change in milk consistency and color by  the 5th day of breastfeeding.   Nipples that are not sore, cracked, or bleeding. Signs That Your [employer] Books is Getting Enough Milk  Wetting at least 3 diapers in a 24-hour period. The urine should be clear and pale yellow by age 64411 days.  At least 3 stools in a 24-hour period by age 64411 days. The stool should be soft and yellow.  At least 3 stools in a 24-hour period by age 644 days. The stool should be seedy and yellow.  No loss of weight greater than 10% of birth weight during the first 22 days of age.  Average weight gain of 4 7 ounces (120 210 mL) per week after age 64 days.  Consistent daily weight gain by age 60 days, without weight loss after the age of 2 weeks. After a feeding, your baby may spit up a small amount. This is common. BREASTFEEDING FREQUENCY AND DURATION Frequent feeding will help you make more milk and can prevent sore nipples and breast engorgement. Breastfeed when you feel the need to reduce  the fullness of your breasts or when your baby shows signs of hunger. This is called "breastfeeding on demand." Avoid introducing a pacifier to your baby while you are working to establish breastfeeding (the first 4 6 weeks after your baby is born). After this time you may choose to use a pacifier. Research has shown that pacifier use during the first year of a baby's life decreases the risk of sudden infant death syndrome (SIDS). Allow your baby to feed on each breast as long as he or she wants. Breastfeed until your baby is finished feeding. When your baby unlatches or falls asleep while feeding from the first breast, offer the second breast. Because newborns are often sleepy in the first few weeks of life, you may need to awaken your baby to get him or her to feed. Breastfeeding times will vary from baby to baby. However, the following rules can serve as a guide to help you ensure that your baby is properly fed:  Newborns (babies 4 weeks of age or younger) may breastfeed every 1 3  hours.  Newborns should not go longer than 3 hours during the day or 5 hours during the night without breastfeeding.  You should breastfeed your baby a minimum of 8 times in a 24-hour period until you begin to introduce solid foods to your baby at around 6 months of age. BREAST MILK PUMPING Pumping and storing breast milk allows you to ensure that your baby is exclusively fed your breast milk, even at times when you are unable to breastfeed. This is especially important if you are going back to work while you are still breastfeeding or when you are not able to be present during feedings. Your lactation consultant can give you guidelines on how long it is safe to store breast milk.  A breast pump is a machine that allows you to pump milk from your breast into a sterile bottle. The pumped breast milk can then be stored in a refrigerator or freezer. Some breast pumps are operated by hand, while others use electricity. Ask your lactation consultant which type will work best for you. Breast pumps can be purchased, but some hospitals and breastfeeding support groups lease breast pumps on a monthly basis. A lactation consultant can teach you how to hand express breast milk, if you prefer not to use a pump.  CARING FOR YOUR BREASTS WHILE YOU BREASTFEED Nipples can become dry, cracked, and sore while breastfeeding. The following recommendations can help keep your breasts moisturized and healthy:  Avoid using soap on your nipples.   Wear a supportive bra. Although not required, special nursing bras and tank tops are designed to allow access to your breasts for breastfeeding without taking off your entire bra or top. Avoid wearing underwire style bras or extremely tight bras.  Air dry your nipples for 3 4minutes after each feeding.   Use only cotton bra pads to absorb leaked breast milk. Leaking of breast milk between feedings is normal.   Use lanolin on your nipples after breastfeeding. Lanolin helps to  maintain your skin's normal moisture barrier. If you use pure lanolin you do not need to wash it off before feeding your baby again. Pure lanolin is not toxic to your baby. You may also hand express a few drops of breast milk and gently massage that milk into your nipples and allow the milk to air dry. In the first few weeks after giving birth, some women experience extremely full breasts (engorgement). Engorgement can make   your breasts feel heavy, warm, and tender to the touch. Engorgement peaks within 3 5 days after you give birth. The following recommendations can help ease engorgement:  Completely empty your breasts while breastfeeding or pumping. You may want to start by applying warm, moist heat (in the shower or with warm water-soaked hand towels) just before feeding or pumping. This increases circulation and helps the milk flow. If your baby does not completely empty your breasts while breastfeeding, pump any extra milk after he or she is finished.  Wear a snug bra (nursing or regular) or tank top for 1 2 days to signal your body to slightly decrease milk production.  Apply ice packs to your breasts, unless this is too uncomfortable for you.  Make sure that your baby is latched on and positioned properly while breastfeeding. If engorgement persists after 48 hours of following these recommendations, contact your health care provider or a Advertising copywriter. OVERALL HEALTH CARE RECOMMENDATIONS WHILE BREASTFEEDING  Eat healthy foods. Alternate between meals and snacks, eating 3 of each per day. Because what you eat affects your breast milk, some of the foods may make your baby more irritable than usual. Avoid eating these foods if you are sure that they are negatively affecting your baby.  Drink milk, fruit juice, and water to satisfy your thirst (about 10 glasses a day).   Rest often, relax, and continue to take your prenatal vitamins to prevent fatigue, stress, and anemia.  Continue  breast self-awareness checks.  Avoid chewing and smoking tobacco.  Avoid alcohol and drug use. Some medicines that may be harmful to your baby can pass through breast milk. It is important to ask your health care provider before taking any medicine, including all over-the-counter and prescription medicine as well as vitamin and herbal supplements. It is possible to become pregnant while breastfeeding. If birth control is desired, ask your health care provider about options that will be safe for your baby. SEEK MEDICAL CARE IF:   You feel like you want to stop breastfeeding or have become frustrated with breastfeeding.  You have painful breasts or nipples.  Your nipples are cracked or bleeding.  Your breasts are red, tender, or warm.  You have a swollen area on either breast.  You have a fever or chills.  You have nausea or vomiting.  You have drainage other than breast milk from your nipples.  Your breasts do not become full before feedings by the 5th day after you give birth.  You feel sad and depressed.  Your baby is too sleepy to eat well.  Your baby is having trouble sleeping.   Your baby is wetting less than 3 diapers in a 24-hour period.  Your baby has less than 3 stools in a 24-hour period.  Your baby's skin or the white part of his or her eyes becomes yellow.   Your baby is not gaining weight by 55 days of age. SEEK IMMEDIATE MEDICAL CARE IF:   Your baby is overly tired (lethargic) and does not want to wake up and feed.  Your baby develops an unexplained fever. Document Released: 05/14/2005 Document Revised: 01/14/2013 Document Reviewed: 11/05/2012 [facility] Patient Information 2014 [place], [place]. Third Trimester of Pregnancy The third trimester is from week 29 through week 42, months 7 through 9. The third trimester is a time when the fetus is growing rapidly. At the end of the ninth month, the fetus is about 20 inches in length and weighs 6 10 pounds.   BODY  CHANGES Your body goes through many changes during pregnancy. The changes vary from woman to woman.   Your weight will continue to increase. You can expect to gain 25 35 pounds (11 16 kg) by the end of the pregnancy.  You may begin to get stretch marks on your hips, abdomen, and breasts.  You may urinate more often because the fetus is moving lower into your pelvis and pressing on your bladder.  You may develop or continue to have heartburn as a result of your pregnancy.  You may develop constipation because certain hormones are causing the muscles that push waste through your intestines to slow down.  You may develop hemorrhoids or swollen, bulging veins (varicose veins).  You may have pelvic pain because of the weight gain and pregnancy hormones relaxing your joints between the bones in your pelvis. Back aches may result from over exertion of the muscles supporting your posture.  Your breasts will continue to grow and be tender. A yellow discharge may leak from your breasts called colostrum.  Your belly button may stick out.  You may feel short of breath because of your expanding uterus.  You may notice the fetus "dropping," or moving lower in your abdomen.  You may have a bloody mucus discharge. This usually occurs a few days to a week before labor begins.  Your cervix becomes thin and soft (effaced) near your due date. WHAT TO EXPECT AT YOUR PRENATAL EXAMS  You will have prenatal exams every 2 weeks until week 36. Then, you will have weekly prenatal exams. During a routine prenatal visit:  You will be weighed to make sure you and the fetus are growing normally.  Your blood pressure is taken.  Your abdomen will be measured to track your baby's growth.  The fetal heartbeat will be listened to.  Any test results from the previous visit will be discussed.  You may have a cervical check near your due date to see if you have effaced. At around 36 weeks, your caregiver  will check your cervix. At the same time, your caregiver will also perform a test on the secretions of the vaginal tissue. This test is to determine if a type of bacteria, Group B streptococcus, is present. Your caregiver will explain this further. Your caregiver may ask you:  What your birth plan is.  How you are feeling.  If you are feeling the baby move.  If you have had any abnormal symptoms, such as leaking fluid, bleeding, severe headaches, or abdominal cramping.  If you have any questions. Other tests or screenings that may be performed during your third trimester include:  Blood tests that check for low iron levels (anemia).  Fetal testing to check the health, activity level, and growth of the fetus. Testing is done if you have certain medical conditions or if there are problems during the pregnancy. FALSE LABOR You may feel small, irregular contractions that eventually go away. These are called Braxton Hicks contractions, or false labor. Contractions may last for hours, days, or even weeks before true labor sets in. If contractions come at regular intervals, intensify, or become painful, it is best to be seen by your caregiver.  SIGNS OF LABOR   Menstrual-like cramps.  Contractions that are 5 minutes apart or less.  Contractions that start on the top of the uterus and spread down to the lower abdomen and back.  A sense of increased pelvic pressure or back pain.  A watery or bloody mucus discharge  that comes from the vagina. If you have any of these signs before the 37th week of pregnancy, call your caregiver right away. You need to go to the hospital to get checked immediately. HOME CARE INSTRUCTIONS   Avoid all smoking, herbs, alcohol, and unprescribed drugs. These chemicals affect the formation and growth of the baby.  Follow your caregiver's instructions regarding medicine use. There are medicines that are either safe or unsafe to take during pregnancy.  Exercise only  as directed by your caregiver. Experiencing uterine cramps is a good sign to stop exercising.  Continue to eat regular, healthy meals.  Wear a good support bra for breast tenderness.  Do not use hot tubs, steam rooms, or saunas.  Wear your seat belt at all times when driving.  Avoid raw meat, uncooked cheese, cat litter boxes, and soil used by cats. These carry germs that can cause birth defects in the baby.  Take your prenatal vitamins.  Try taking a stool softener (if your caregiver approves) if you develop constipation. Eat more high-fiber foods, such as fresh vegetables or fruit and whole grains. Drink plenty of fluids to keep your urine clear or pale yellow.  Take warm sitz baths to soothe any pain or discomfort caused by hemorrhoids. Use hemorrhoid cream if your caregiver approves.  If you develop varicose veins, wear support hose. Elevate your feet for 15 minutes, 3 4 times a day. Limit salt in your diet.  Avoid heavy lifting, wear low heal shoes, and practice good posture.  Rest a lot with your legs elevated if you have leg cramps or low back pain.  Visit your dentist if you have not gone during your pregnancy. Use a soft toothbrush to brush your teeth and be gentle when you floss.  A sexual relationship may be continued unless your caregiver directs you otherwise.  Do not travel far distances unless it is absolutely necessary and only with the approval of your caregiver.  Take prenatal classes to understand, practice, and ask questions about the labor and delivery.  Make a trial run to the hospital.  Pack your hospital bag.  Prepare the baby's nursery.  Continue to go to all your prenatal visits as directed by your caregiver. SEEK MEDICAL CARE IF:  You are unsure if you are in labor or if your water has broken.  You have dizziness.  You have mild pelvic cramps, pelvic pressure, or nagging pain in your abdominal area.  You have persistent nausea, vomiting, or  diarrhea.  You have a bad smelling vaginal discharge.  You have pain with urination. SEEK IMMEDIATE MEDICAL CARE IF:   You have a fever.  You are leaking fluid from your vagina.  You have spotting or bleeding from your vagina.  You have severe abdominal cramping or pain.  You have rapid weight loss or gain.  You have shortness of breath with chest pain.  You notice sudden or extreme swelling of your face, hands, ankles, feet, or legs.  You have not felt your baby move in over an hour.  You have severe headaches that do not go away with medicine.  You have vision changes. Document Released: 05/08/2001 Document Revised: 01/14/2013 Document Reviewed: 07/15/2012 [facility] Patient Information 2014 [place], [place].

## 2013-05-20 ENCOUNTER — Encounter: Payer: Self-pay | Admitting: Family Medicine

## 2013-05-20 LAB — RPR

## 2013-05-20 LAB — HIV ANTIBODY (ROUTINE TESTING W REFLEX): HIV: NONREACTIVE

## 2013-05-22 ENCOUNTER — Ambulatory Visit (HOSPITAL_COMMUNITY)
Admission: RE | Admit: 2013-05-22 | Discharge: 2013-05-22 | Disposition: A | Discharge status: Home / Self Care | Payer: Medicaid Other | Source: Ambulatory Visit | Attending: Family Medicine | Admitting: Family Medicine

## 2013-05-22 DIAGNOSIS — O10013 Pre-existing essential hypertension complicating pregnancy, third trimester: Secondary | ICD-10-CM

## 2013-05-22 DIAGNOSIS — O99330 Smoking (tobacco) complicating pregnancy, unspecified trimester: Secondary | ICD-10-CM | POA: Insufficient documentation

## 2013-05-22 DIAGNOSIS — Z8751 Personal history of pre-term labor: Secondary | ICD-10-CM | POA: Insufficient documentation

## 2013-05-22 DIAGNOSIS — O10019 Pre-existing essential hypertension complicating pregnancy, unspecified trimester: Secondary | ICD-10-CM | POA: Insufficient documentation

## 2013-05-26 ENCOUNTER — Ambulatory Visit (INDEPENDENT_AMBULATORY_CARE_PROVIDER_SITE_OTHER): Payer: Medicaid Other | Admitting: Obstetrics & Gynecology

## 2013-05-26 ENCOUNTER — Encounter: Payer: Self-pay | Admitting: Obstetrics & Gynecology

## 2013-05-26 VITALS — BP 115/61 | Wt 214.2 lb

## 2013-05-26 DIAGNOSIS — O10019 Pre-existing essential hypertension complicating pregnancy, unspecified trimester: Secondary | ICD-10-CM

## 2013-05-26 DIAGNOSIS — O10013 Pre-existing essential hypertension complicating pregnancy, third trimester: Secondary | ICD-10-CM

## 2013-05-26 LAB — POCT URINALYSIS DIP (DEVICE)
Glucose, UA: NEGATIVE mg/dL
Hgb urine dipstick: NEGATIVE
Leukocytes, UA: NEGATIVE
Nitrite: NEGATIVE
Protein, ur: 30 mg/dL — AB
Urobilinogen, UA: 1 mg/dL (ref 0.0–1.0)

## 2013-05-26 NOTE — Progress Notes (Signed)
P=105 NST today

## 2013-05-26 NOTE — Progress Notes (Signed)
NST reviewed and reactive. Pt with on complaints.  Shirley Tran, M.D., [place]

## 2013-05-26 NOTE — Patient Instructions (Signed)
Third Trimester of Pregnancy  The third trimester is from week 29 through week 42, months 7 through 9. The third trimester is a time when the fetus is growing rapidly. At the end of the ninth month, the fetus is about 20 inches in length and weighs 6 10 pounds.   BODY CHANGES  Your body goes through many changes during pregnancy. The changes vary from woman to woman.    Your weight will continue to increase. You can expect to gain 25 35 pounds (11 16 kg) by the end of the pregnancy.   You may begin to get stretch marks on your hips, abdomen, and breasts.   You may urinate more often because the fetus is moving lower into your pelvis and pressing on your bladder.   You may develop or continue to have heartburn as a result of your pregnancy.   You may develop constipation because certain hormones are causing the muscles that push waste through your intestines to slow down.   You may develop hemorrhoids or swollen, bulging veins (varicose veins).   You may have pelvic pain because of the weight gain and pregnancy hormones relaxing your joints between the bones in your pelvis. Back aches may result from over exertion of the muscles supporting your posture.   Your breasts will continue to grow and be tender. A yellow discharge may leak from your breasts called colostrum.   Your belly button may stick out.   You may feel short of breath because of your expanding uterus.   You may notice the fetus "dropping," or moving lower in your abdomen.   You may have a bloody mucus discharge. This usually occurs a few days to a week before labor begins.   Your cervix becomes thin and soft (effaced) near your due date.  WHAT TO EXPECT AT YOUR PRENATAL EXAMS   You will have prenatal exams every 2 weeks until week 36. Then, you will have weekly prenatal exams. During a routine prenatal visit:   You will be weighed to make sure you and the fetus are growing normally.   Your blood pressure is taken.   Your abdomen will be  measured to track your baby's growth.   The fetal heartbeat will be listened to.   Any test results from the previous visit will be discussed.   You may have a cervical check near your due date to see if you have effaced.  At around 36 weeks, your caregiver will check your cervix. At the same time, your caregiver will also perform a test on the secretions of the vaginal tissue. This test is to determine if a type of bacteria, Group B streptococcus, is present. Your caregiver will explain this further.  Your caregiver may ask you:   What your birth plan is.   How you are feeling.   If you are feeling the baby move.   If you have had any abnormal symptoms, such as leaking fluid, bleeding, severe headaches, or abdominal cramping.   If you have any questions.  Other tests or screenings that may be performed during your third trimester include:   Blood tests that check for low iron levels (anemia).   Fetal testing to check the health, activity level, and growth of the fetus. Testing is done if you have certain medical conditions or if there are problems during the pregnancy.  FALSE LABOR  You may feel small, irregular contractions that eventually go away. These are called Braxton Hicks contractions, or   false labor. Contractions may last for hours, days, or even weeks before true labor sets in. If contractions come at regular intervals, intensify, or become painful, it is best to be seen by your caregiver.   SIGNS OF LABOR    Menstrual-like cramps.   Contractions that are 5 minutes apart or less.   Contractions that start on the top of the uterus and spread down to the lower abdomen and back.   A sense of increased pelvic pressure or back pain.   A watery or bloody mucus discharge that comes from the vagina.  If you have any of these signs before the 37th week of pregnancy, call your caregiver right away. You need to go to the hospital to get checked immediately.  HOME CARE INSTRUCTIONS    Avoid all  smoking, herbs, alcohol, and unprescribed drugs. These chemicals affect the formation and growth of the baby.   Follow your caregiver's instructions regarding medicine use. There are medicines that are either safe or unsafe to take during pregnancy.   Exercise only as directed by your caregiver. Experiencing uterine cramps is a good sign to stop exercising.   Continue to eat regular, healthy meals.   Wear a good support bra for breast tenderness.   Do not use hot tubs, steam rooms, or saunas.   Wear your seat belt at all times when driving.   Avoid raw meat, uncooked cheese, cat litter boxes, and soil used by cats. These carry germs that can cause birth defects in the baby.   Take your prenatal vitamins.   Try taking a stool softener (if your caregiver approves) if you develop constipation. Eat more high-fiber foods, such as fresh vegetables or fruit and whole grains. Drink plenty of fluids to keep your urine clear or pale yellow.   Take warm sitz baths to soothe any pain or discomfort caused by hemorrhoids. Use hemorrhoid cream if your caregiver approves.   If you develop varicose veins, wear support hose. Elevate your feet for 15 minutes, 3 4 times a day. Limit salt in your diet.   Avoid heavy lifting, wear low heal shoes, and practice good posture.   Rest a lot with your legs elevated if you have leg cramps or low back pain.   Visit your dentist if you have not gone during your pregnancy. Use a soft toothbrush to brush your teeth and be gentle when you floss.   A sexual relationship may be continued unless your caregiver directs you otherwise.   Do not travel far distances unless it is absolutely necessary and only with the approval of your caregiver.   Take prenatal classes to understand, practice, and ask questions about the labor and delivery.   Make a trial run to the hospital.   Pack your hospital bag.   Prepare the baby's nursery.   Continue to go to all your prenatal visits as directed  by your caregiver.  SEEK MEDICAL CARE IF:   You are unsure if you are in labor or if your water has broken.   You have dizziness.   You have mild pelvic cramps, pelvic pressure, or nagging pain in your abdominal area.   You have persistent nausea, vomiting, or diarrhea.   You have a bad smelling vaginal discharge.   You have pain with urination.  SEEK IMMEDIATE MEDICAL CARE IF:    You have a fever.   You are leaking fluid from your vagina.   You have spotting or bleeding from your vagina.     You have severe abdominal cramping or pain.   You have rapid weight loss or gain.   You have shortness of breath with chest pain.   You notice sudden or extreme swelling of your face, hands, ankles, feet, or legs.   You have not felt your baby move in over an hour.   You have severe headaches that do not go away with medicine.   You have vision changes.  Document Released: 05/08/2001 Document Revised: 01/14/2013 Document Reviewed: 07/15/2012  [employer] Patient Information 2014 [employer].

## 2013-05-29 ENCOUNTER — Ambulatory Visit (HOSPITAL_COMMUNITY): Payer: Self-pay

## 2013-06-01 ENCOUNTER — Encounter: Payer: Self-pay | Admitting: Family Medicine

## 2013-06-01 ENCOUNTER — Ambulatory Visit (INDEPENDENT_AMBULATORY_CARE_PROVIDER_SITE_OTHER): Payer: Self-pay | Admitting: Family Medicine

## 2013-06-01 VITALS — BP 102/82 | Temp 97.3°F | Wt 216.4 lb

## 2013-06-01 DIAGNOSIS — O0993 Supervision of high risk pregnancy, unspecified, third trimester: Secondary | ICD-10-CM

## 2013-06-01 DIAGNOSIS — O10013 Pre-existing essential hypertension complicating pregnancy, third trimester: Secondary | ICD-10-CM

## 2013-06-01 DIAGNOSIS — O10019 Pre-existing essential hypertension complicating pregnancy, unspecified trimester: Secondary | ICD-10-CM

## 2013-06-01 LAB — FETAL NONSTRESS TEST

## 2013-06-01 LAB — POCT URINALYSIS DIP (DEVICE)
GLUCOSE, UA: NEGATIVE mg/dL
HGB URINE DIPSTICK: NEGATIVE
Nitrite: NEGATIVE
Protein, ur: NEGATIVE mg/dL
SPECIFIC GRAVITY, URINE: 1.025 (ref 1.005–1.030)
Urobilinogen, UA: 1 mg/dL (ref 0.0–1.0)
pH: 6.5 (ref 5.0–8.0)

## 2013-06-01 NOTE — Progress Notes (Signed)
P= 100 C/o of intermittent, mild lower abdominal/pelvic pressure.

## 2013-06-01 NOTE — Progress Notes (Signed)
occassional headaches in AM resolved, no vision changes, no abd pain (RUQ) +FM, no lof, no vb, occassional ctx  BP at goal Reactive NST - 150s, mod var, 2 accels 15x15, 1 variable decel, not recurrent to 110 <421min quick recovery.  Shirley Tran is a 23 y.o. [id number] at 7081w1d here for ROB visit.  Discussed with Patient:  -Plans to breast feed.  All questions answered. -Continue prenatal vitamins. -Reviewed fetal kick counts Pt to perform daily at a time when the baby is active, lie laterally with both hands on belly in quiet room and count all movements (hiccups, shoulder rolls, obvious kicks, etc); pt is to report to clinic L&D for less than 10 movements felt in a one hour time period-pt told as soon as she counts 10 movements the count is complete.  - Routine precautions discussed (depression, infection s/s).   Patient provided with all pertinent phone numbers for emergencies. - RTC for any VB, regular, painful cramps/ctxs occurring at a rate of >2/10 min, fever (100.5 or higher), n/v/d, any pain that is unresolving or worsening, LOF, decreased fetal movement, CP, SOB, edema - RTC in 1 weeks for next appt. - GBS next week  Problems: Patient Active Problem List   Diagnosis Date Noted  . Marijuana use 01/22/2013  . Supervision of high-risk pregnancy 12/25/2012  . Hx of preterm delivery, currently pregnant 12/25/2012  . Benign essential hypertension antepartum 12/25/2012    To Do: 1. Labor bag packed  [ ]  Vaccines: Flu: recd Tdap: recd [ ]  BCM: mirena [ ]  Readiness: baby has a place to sleep, car seat, other baby necessities.  Edu: [ x] PTL precautions; [ ]  BF class; [ ]  childbirth class; [ ]   BF counseling;

## 2013-06-01 NOTE — Patient Instructions (Signed)
Third Trimester of Pregnancy  The third trimester is from week 29 through week 42, months 7 through 9. The third trimester is a time when the fetus is growing rapidly. At the end of the ninth month, the fetus is about 20 inches in length and weighs 6 10 pounds.   BODY CHANGES  Your body goes through many changes during pregnancy. The changes vary from woman to woman.    Your weight will continue to increase. You can expect to gain 25 35 pounds (11 16 kg) by the end of the pregnancy.   You may begin to get stretch marks on your hips, abdomen, and breasts.   You may urinate more often because the fetus is moving lower into your pelvis and pressing on your bladder.   You may develop or continue to have heartburn as a result of your pregnancy.   You may develop constipation because certain hormones are causing the muscles that push waste through your intestines to slow down.   You may develop hemorrhoids or swollen, bulging veins (varicose veins).   You may have pelvic pain because of the weight gain and pregnancy hormones relaxing your joints between the bones in your pelvis. Back aches may result from over exertion of the muscles supporting your posture.   Your breasts will continue to grow and be tender. A yellow discharge may leak from your breasts called colostrum.   Your belly button may stick out.   You may feel short of breath because of your expanding uterus.   You may notice the fetus "dropping," or moving lower in your abdomen.   You may have a bloody mucus discharge. This usually occurs a few days to a week before labor begins.   Your cervix becomes thin and soft (effaced) near your due date.  WHAT TO EXPECT AT YOUR PRENATAL EXAMS   You will have prenatal exams every 2 weeks until week 36. Then, you will have weekly prenatal exams. During a routine prenatal visit:   You will be weighed to make sure you and the fetus are growing normally.   Your blood pressure is taken.   Your abdomen will be  measured to track your baby's growth.   The fetal heartbeat will be listened to.   Any test results from the previous visit will be discussed.   You may have a cervical check near your due date to see if you have effaced.  At around 36 weeks, your caregiver will check your cervix. At the same time, your caregiver will also perform a test on the secretions of the vaginal tissue. This test is to determine if a type of bacteria, Group B streptococcus, is present. Your caregiver will explain this further.  Your caregiver may ask you:   What your birth plan is.   How you are feeling.   If you are feeling the baby move.   If you have had any abnormal symptoms, such as leaking fluid, bleeding, severe headaches, or abdominal cramping.   If you have any questions.  Other tests or screenings that may be performed during your third trimester include:   Blood tests that check for low iron levels (anemia).   Fetal testing to check the health, activity level, and growth of the fetus. Testing is done if you have certain medical conditions or if there are problems during the pregnancy.  FALSE LABOR  You may feel small, irregular contractions that eventually go away. These are called Braxton Hicks contractions, or   false labor. Contractions may last for hours, days, or even weeks before true labor sets in. If contractions come at regular intervals, intensify, or become painful, it is best to be seen by your caregiver.   SIGNS OF LABOR    Menstrual-like cramps.   Contractions that are 5 minutes apart or less.   Contractions that start on the top of the uterus and spread down to the lower abdomen and back.   A sense of increased pelvic pressure or back pain.   A watery or bloody mucus discharge that comes from the vagina.  If you have any of these signs before the 37th week of pregnancy, call your caregiver right away. You need to go to the hospital to get checked immediately.  HOME CARE INSTRUCTIONS    Avoid all  smoking, herbs, alcohol, and unprescribed drugs. These chemicals affect the formation and growth of the baby.   Follow your caregiver's instructions regarding medicine use. There are medicines that are either safe or unsafe to take during pregnancy.   Exercise only as directed by your caregiver. Experiencing uterine cramps is a good sign to stop exercising.   Continue to eat regular, healthy meals.   Wear a good support bra for breast tenderness.   Do not use hot tubs, steam rooms, or saunas.   Wear your seat belt at all times when driving.   Avoid raw meat, uncooked cheese, cat litter boxes, and soil used by cats. These carry germs that can cause birth defects in the baby.   Take your prenatal vitamins.   Try taking a stool softener (if your caregiver approves) if you develop constipation. Eat more high-fiber foods, such as fresh vegetables or fruit and whole grains. Drink plenty of fluids to keep your urine clear or pale yellow.   Take warm sitz baths to soothe any pain or discomfort caused by hemorrhoids. Use hemorrhoid cream if your caregiver approves.   If you develop varicose veins, wear support hose. Elevate your feet for 15 minutes, 3 4 times a day. Limit salt in your diet.   Avoid heavy lifting, wear low heal shoes, and practice good posture.   Rest a lot with your legs elevated if you have leg cramps or low back pain.   Visit your dentist if you have not gone during your pregnancy. Use a soft toothbrush to brush your teeth and be gentle when you floss.   A sexual relationship may be continued unless your caregiver directs you otherwise.   Do not travel far distances unless it is absolutely necessary and only with the approval of your caregiver.   Take prenatal classes to understand, practice, and ask questions about the labor and delivery.   Make a trial run to the hospital.   Pack your hospital bag.   Prepare the baby's nursery.   Continue to go to all your prenatal visits as directed  by your caregiver.  SEEK MEDICAL CARE IF:   You are unsure if you are in labor or if your water has broken.   You have dizziness.   You have mild pelvic cramps, pelvic pressure, or nagging pain in your abdominal area.   You have persistent nausea, vomiting, or diarrhea.   You have a bad smelling vaginal discharge.   You have pain with urination.  SEEK IMMEDIATE MEDICAL CARE IF:    You have a fever.   You are leaking fluid from your vagina.   You have spotting or bleeding from your vagina.     You have severe abdominal cramping or pain.   You have rapid weight loss or gain.   You have shortness of breath with chest pain.   You notice sudden or extreme swelling of your face, hands, ankles, feet, or legs.   You have not felt your baby move in over an hour.   You have severe headaches that do not go away with medicine.   You have vision changes.  Document Released: 05/08/2001 Document Revised: 01/14/2013 Document Reviewed: 07/15/2012  [employer] Patient Information 2014 [employer].

## 2013-06-01 NOTE — Addendum Note (Signed)
Addended by: Jill SideAY, Mercer Stallworth L on: 06/01/2013 10:51 AM   Modules accepted: Orders

## 2013-06-02 ENCOUNTER — Ambulatory Visit (HOSPITAL_COMMUNITY): Payer: Self-pay

## 2013-06-03 ENCOUNTER — Encounter: Payer: Self-pay | Admitting: *Deleted

## 2013-06-08 ENCOUNTER — Ambulatory Visit (INDEPENDENT_AMBULATORY_CARE_PROVIDER_SITE_OTHER): Payer: Self-pay | Admitting: Obstetrics & Gynecology

## 2013-06-08 VITALS — BP 130/89 | Wt 216.1 lb

## 2013-06-08 DIAGNOSIS — O26893 Other specified pregnancy related conditions, third trimester: Secondary | ICD-10-CM

## 2013-06-08 DIAGNOSIS — O10019 Pre-existing essential hypertension complicating pregnancy, unspecified trimester: Secondary | ICD-10-CM

## 2013-06-08 DIAGNOSIS — N898 Other specified noninflammatory disorders of vagina: Secondary | ICD-10-CM

## 2013-06-08 DIAGNOSIS — O10013 Pre-existing essential hypertension complicating pregnancy, third trimester: Secondary | ICD-10-CM

## 2013-06-08 DIAGNOSIS — A499 Bacterial infection, unspecified: Secondary | ICD-10-CM

## 2013-06-08 DIAGNOSIS — B9689 Other specified bacterial agents as the cause of diseases classified elsewhere: Secondary | ICD-10-CM

## 2013-06-08 DIAGNOSIS — N760 Acute vaginitis: Secondary | ICD-10-CM

## 2013-06-08 DIAGNOSIS — O9989 Other specified diseases and conditions complicating pregnancy, childbirth and the puerperium: Secondary | ICD-10-CM

## 2013-06-08 LAB — OB RESULTS CONSOLE GBS: GBS: NEGATIVE

## 2013-06-08 LAB — POCT URINALYSIS DIP (DEVICE)
BILIRUBIN URINE: NEGATIVE
Glucose, UA: NEGATIVE mg/dL
Hgb urine dipstick: NEGATIVE
KETONES UR: NEGATIVE mg/dL
Nitrite: NEGATIVE
Protein, ur: NEGATIVE mg/dL
Specific Gravity, Urine: 1.015 (ref 1.005–1.030)
Urobilinogen, UA: 1 mg/dL (ref 0.0–1.0)
pH: 7 (ref 5.0–8.0)

## 2013-06-08 LAB — US OB FOLLOW UP

## 2013-06-08 NOTE — Progress Notes (Signed)
NST performed today was reviewed and was found to be reactive.  AFI normal at 17.3  Continue recommended antenatal testing and prenatal care. Pelvic cultures done today.  Patient reports she may have a STI, sexual partner is not faithful.   Reports thick, foul smelling vaginal discharge.  Thin, white discharge seen on exam. Wet prep sent.  Safe sex practices emphasized. Fetal movement and labor precautions reviewed.

## 2013-06-08 NOTE — Patient Instructions (Signed)
Return to clinic for any obstetric concerns or go to [facility] for evaluation  

## 2013-06-08 NOTE — Progress Notes (Signed)
P = 101     Pt reports that she does not take her Zoloft every [place] because it makes her sleepy and not want to do anything.

## 2013-06-09 ENCOUNTER — Telehealth: Payer: Self-pay

## 2013-06-09 LAB — WET PREP, GENITAL
Trich, Wet Prep: NONE SEEN
YEAST WET PREP: NONE SEEN

## 2013-06-09 LAB — GC/CHLAMYDIA PROBE AMP
CT PROBE, AMP APTIMA: NEGATIVE
GC Probe RNA: NEGATIVE

## 2013-06-09 MED ORDER — METRONIDAZOLE 500 MG PO TABS: 500.0000 mg | ORAL_TABLET | Freq: Two times a day (BID) | ORAL | Status: AC

## 2013-06-09 NOTE — Telephone Encounter (Signed)
Message copied by Louanna RawAMPBELL, Reyce Lubeck M on Tue Jun 09, 2013  9:53 AM ------      Message from: Jaynie CollinsANYANWU, UGONNA A      Created: Tue Jun 09, 2013  8:30 AM       Wet prep showed clue cells consistent with BV, Metronidazole e-prescribed. Please call to inform patient of results and advise her to pick up prescription.        ------

## 2013-06-09 NOTE — Addendum Note (Signed)
Addended by: Jaynie CollinsANYANWU, UGONNA A on: 06/09/2013 08:30 AM   Modules accepted: Orders

## 2013-06-09 NOTE — Telephone Encounter (Signed)
Called pt. And informed her of BV and [facility]. Pt. Verbalized understanding and had no questions or concerns.

## 2013-06-10 LAB — CULTURE, BETA STREP (GROUP B ONLY)

## 2013-06-11 ENCOUNTER — Encounter: Payer: Self-pay | Admitting: Obstetrics & Gynecology

## 2013-06-11 ENCOUNTER — Ambulatory Visit (INDEPENDENT_AMBULATORY_CARE_PROVIDER_SITE_OTHER): Payer: Self-pay | Admitting: *Deleted

## 2013-06-11 VITALS — BP 128/74

## 2013-06-11 DIAGNOSIS — O10019 Pre-existing essential hypertension complicating pregnancy, unspecified trimester: Secondary | ICD-10-CM

## 2013-06-11 NOTE — Progress Notes (Signed)
P = 119

## 2013-06-15 ENCOUNTER — Inpatient Hospital Stay (HOSPITAL_COMMUNITY)
Admission: AD | Admit: 2013-06-15 | Discharge: 2013-06-15 | Disposition: A | Discharge status: Home / Self Care | Payer: Medicaid Other | Source: Ambulatory Visit | Attending: Obstetrics & Gynecology | Admitting: Obstetrics & Gynecology

## 2013-06-15 ENCOUNTER — Other Ambulatory Visit: Payer: Self-pay

## 2013-06-15 ENCOUNTER — Encounter (HOSPITAL_COMMUNITY): Payer: Self-pay | Admitting: *Deleted

## 2013-06-15 DIAGNOSIS — O09899 Supervision of other high risk pregnancies, unspecified trimester: Secondary | ICD-10-CM

## 2013-06-15 DIAGNOSIS — O479 False labor, unspecified: Secondary | ICD-10-CM | POA: Insufficient documentation

## 2013-06-15 DIAGNOSIS — O09219 Supervision of pregnancy with history of pre-term labor, unspecified trimester: Secondary | ICD-10-CM

## 2013-06-15 DIAGNOSIS — O0990 Supervision of high risk pregnancy, unspecified, unspecified trimester: Secondary | ICD-10-CM

## 2013-06-15 NOTE — Discharge Instructions (Signed)
Braxton Hicks Contractions Pregnancy is commonly associated with contractions of the uterus throughout the pregnancy. Towards the end of pregnancy (32 to 34 weeks), these contractions (Braxton Hicks) can develop more often and may become more forceful. This is not true labor because these contractions do not result in opening (dilatation) and thinning of the cervix. They are sometimes difficult to tell apart from true labor because these contractions can be forceful and people have different pain tolerances. You should not feel embarrassed if you go to the hospital with false labor. Sometimes, the only way to tell if you are in true labor is for your caregiver to follow the changes in the cervix. How to tell the difference between true and false labor:  False labor.  The contractions of false labor are usually shorter, irregular and not as hard as those of true labor.  They are often felt in the front of the lower abdomen and in the groin.  They may leave with walking around or changing positions while lying down.  They get weaker and are shorter lasting as time goes on.  These contractions are usually irregular.  They do not usually become progressively stronger, regular and closer together as with true labor.  True labor.  Contractions in true labor last 30 to 70 seconds, become very regular, usually become more intense, and increase in frequency.  They do not go away with walking.  The discomfort is usually felt in the top of the uterus and spreads to the lower abdomen and low back.  True labor can be determined by your caregiver with an exam. This will show that the cervix is dilating and getting thinner. If there are no prenatal problems or other health problems associated with the pregnancy, it is completely safe to be sent home with false labor and await the onset of true labor. HOME CARE INSTRUCTIONS   Keep up with your usual exercises and instructions.  Take medications as  directed.  Keep your regular prenatal appointment.  Eat and drink lightly if you think you are going into labor.  If BH contractions are making you uncomfortable:  Change your activity position from lying down or resting to walking/walking to resting.  Sit and rest in a tub of warm water.  Drink 2 to 3 glasses of water. Dehydration may cause B-H contractions.  Do slow and deep breathing several times an hour. SEEK IMMEDIATE MEDICAL CARE IF:   Your contractions continue to become stronger, more regular, and closer together.  You have a gushing, burst or leaking of fluid from the vagina.  An oral temperature above 102 F (38.9 C) develops.  You have passage of blood-tinged mucus.  You develop vaginal bleeding.  You develop continuous belly (abdominal) pain.  You have low back pain that you never had before.  You feel the baby's head pushing down causing pelvic pressure.  The baby is not moving as much as it used to. Document Released: 05/14/2005 Document Revised: 08/06/2011 Document Reviewed: 02/23/2013 [employer] Patient Information 2014 [employer].  Fetal Movement Counts Patient Name: __________________________________________________ Patient Due Date: ____________________ Performing a fetal movement count is highly recommended in high-risk pregnancies, but it is good for every pregnant woman to do. Your caregiver may ask you to start counting fetal movements at 28 weeks of the pregnancy. Fetal movements often increase:  After eating a full meal.  After physical activity.  After eating or drinking something sweet or cold.  At rest. Pay attention to when you feel   the baby is most active. This will help you notice a pattern of your baby's sleep and wake cycles and what factors contribute to an increase in fetal movement. It is important to perform a fetal movement count at the same time each day when your baby is normally most active.  HOW TO COUNT FETAL  MOVEMENTS 1. Find a quiet and comfortable area to sit or lie down on your left side. Lying on your left side provides the best blood and oxygen circulation to your baby. 2. Write down the day and time on a sheet of paper or in a journal. 3. Start counting kicks, flutters, swishes, rolls, or jabs in a 2 hour period. You should feel at least 10 movements within 2 hours. 4. If you do not feel 10 movements in 2 hours, wait 2 3 hours and count again. Look for a change in the pattern or not enough counts in 2 hours. SEEK MEDICAL CARE IF:  You feel less than 10 counts in 2 hours, tried twice.  There is no movement in over an hour.  The pattern is changing or taking longer each day to reach 10 counts in 2 hours.  You feel the baby is not moving as he or she usually does. Date: ____________ Movements: ____________ Start time: ____________ Finish time: ____________  Date: ____________ Movements: ____________ Start time: ____________ Finish time: ____________ Date: ____________ Movements: ____________ Start time: ____________ Finish time: ____________ Date: ____________ Movements: ____________ Start time: ____________ Finish time: ____________ Date: ____________ Movements: ____________ Start time: ____________ Finish time: ____________ Date: ____________ Movements: ____________ Start time: ____________ Finish time: ____________ Date: ____________ Movements: ____________ Start time: ____________ Finish time: ____________ Date: ____________ Movements: ____________ Start time: ____________ Finish time: ____________  Date: ____________ Movements: ____________ Start time: ____________ Finish time: ____________ Date: ____________ Movements: ____________ Start time: ____________ Finish time: ____________ Date: ____________ Movements: ____________ Start time: ____________ Finish time: ____________ Date: ____________ Movements: ____________ Start time: ____________ Finish time: ____________ Date: ____________  Movements: ____________ Start time: ____________ Finish time: ____________ Date: ____________ Movements: ____________ Start time: ____________ Finish time: ____________ Date: ____________ Movements: ____________ Start time: ____________ Finish time: ____________  Date: ____________ Movements: ____________ Start time: ____________ Finish time: ____________ Date: ____________ Movements: ____________ Start time: ____________ Finish time: ____________ Date: ____________ Movements: ____________ Start time: ____________ Finish time: ____________ Date: ____________ Movements: ____________ Start time: ____________ Finish time: ____________ Date: ____________ Movements: ____________ Start time: ____________ Finish time: ____________ Date: ____________ Movements: ____________ Start time: ____________ Finish time: ____________ Date: ____________ Movements: ____________ Start time: ____________ Finish time: ____________  Date: ____________ Movements: ____________ Start time: ____________ Finish time: ____________ Date: ____________ Movements: ____________ Start time: ____________ Finish time: ____________ Date: ____________ Movements: ____________ Start time: ____________ Finish time: ____________ Date: ____________ Movements: ____________ Start time: ____________ Finish time: ____________ Date: ____________ Movements: ____________ Start time: ____________ Finish time: ____________ Date: ____________ Movements: ____________ Start time: ____________ Finish time: ____________ Date: ____________ Movements: ____________ Start time: ____________ Finish time: ____________  Date: ____________ Movements: ____________ Start time: ____________ Finish time: ____________ Date: ____________ Movements: ____________ Start time: ____________ Finish time: ____________ Date: ____________ Movements: ____________ Start time: ____________ Finish time: ____________ Date: ____________ Movements: ____________ Start time:  ____________ Finish time: ____________ Date: ____________ Movements: ____________ Start time: ____________ Finish time: ____________ Date: ____________ Movements: ____________ Start time: ____________ Finish time: ____________ Date: ____________ Movements: ____________ Start time: ____________ Finish time: ____________  Date: ____________ Movements: ____________ Start time: ____________ Finish time: ____________ Date: ____________ Movements: ____________ Start   time: ____________ Finish time: ____________ Date: ____________ Movements: ____________ Start time: ____________ Finish time: ____________ Date: ____________ Movements: ____________ Start time: ____________ Finish time: ____________ Date: ____________ Movements: ____________ Start time: ____________ Finish time: ____________ Date: ____________ Movements: ____________ Start time: ____________ Finish time: ____________ Date: ____________ Movements: ____________ Start time: ____________ Finish time: ____________  Date: ____________ Movements: ____________ Start time: ____________ Finish time: ____________ Date: ____________ Movements: ____________ Start time: ____________ Finish time: ____________ Date: ____________ Movements: ____________ Start time: ____________ Finish time: ____________ Date: ____________ Movements: ____________ Start time: ____________ Finish time: ____________ Date: ____________ Movements: ____________ Start time: ____________ Finish time: ____________ Date: ____________ Movements: ____________ Start time: ____________ Finish time: ____________ Date: ____________ Movements: ____________ Start time: ____________ Finish time: ____________  Date: ____________ Movements: ____________ Start time: ____________ Finish time: ____________ Date: ____________ Movements: ____________ Start time: ____________ Finish time: ____________ Date: ____________ Movements: ____________ Start time: ____________ Finish time: ____________ Date:  ____________ Movements: ____________ Start time: ____________ Finish time: ____________ Date: ____________ Movements: ____________ Start time: ____________ Finish time: ____________ Date: ____________ Movements: ____________ Start time: ____________ Finish time: ____________ Document Released: 06/13/2006 Document Revised: 04/30/2012 Document Reviewed: 03/10/2012 [employer] Patient Information 2014 [employer].  

## 2013-06-15 NOTE — [facility] Note (Signed)
Pt states she started having contractions at 2200 irregular at first now about 3 minutes apart. Denies VB or LOF.

## 2013-06-18 ENCOUNTER — Ambulatory Visit (INDEPENDENT_AMBULATORY_CARE_PROVIDER_SITE_OTHER): Payer: Medicaid Other | Admitting: Obstetrics & Gynecology

## 2013-06-18 VITALS — BP 123/68

## 2013-06-18 DIAGNOSIS — O10019 Pre-existing essential hypertension complicating pregnancy, unspecified trimester: Secondary | ICD-10-CM

## 2013-06-18 LAB — POCT URINALYSIS DIP (DEVICE)
GLUCOSE, UA: NEGATIVE mg/dL
Hgb urine dipstick: NEGATIVE
Ketones, ur: NEGATIVE mg/dL
Nitrite: NEGATIVE
PROTEIN: 30 mg/dL — AB
SPECIFIC GRAVITY, URINE: 1.02 (ref 1.005–1.030)
UROBILINOGEN UA: 1 mg/dL (ref 0.0–1.0)
pH: 6.5 (ref 5.0–8.0)

## 2013-06-18 NOTE — Patient Instructions (Signed)
Return to clinic for any obstetric concerns or go to [facility] for evaluation  

## 2013-06-18 NOTE — Progress Notes (Signed)
P = 118    Pt was seen @ [facility] on 1/19 for r/o labor.  She desires her cx to be examined today as she is planning an out of town trip this evening to [place], [place] and does not want to leave if her cx has changed.  Pt reports that she gets a H/A every [place] which goes away with sleep. She does not want to take Tylenol for the H/A.  This has been happening for 1.5 months- no change. She denies visual disturbances.   US for growth scheduled @ 38 wks on 1/26 @ 1000.

## 2013-06-18 NOTE — Progress Notes (Signed)
NST performed today was reviewed and was found to be reactive.  Continue recommended antenatal testing and prenatal care. Cervix 1/thick/long.  Patient reassured.  No other complaints or concerns.  Fetal movement and labor precautions reviewed.

## 2013-06-22 ENCOUNTER — Ambulatory Visit (HOSPITAL_COMMUNITY)
Admission: RE | Admit: 2013-06-22 | Discharge: 2013-06-22 | Disposition: A | Discharge status: Home / Self Care | Payer: Medicaid Other | Source: Ambulatory Visit | Attending: Obstetrics & Gynecology | Admitting: Obstetrics & Gynecology

## 2013-06-22 ENCOUNTER — Ambulatory Visit (INDEPENDENT_AMBULATORY_CARE_PROVIDER_SITE_OTHER): Payer: Medicaid Other | Admitting: Obstetrics & Gynecology

## 2013-06-22 VITALS — BP 129/78 | Wt 224.6 lb

## 2013-06-22 DIAGNOSIS — O99330 Smoking (tobacco) complicating pregnancy, unspecified trimester: Secondary | ICD-10-CM | POA: Insufficient documentation

## 2013-06-22 DIAGNOSIS — O10019 Pre-existing essential hypertension complicating pregnancy, unspecified trimester: Secondary | ICD-10-CM

## 2013-06-22 DIAGNOSIS — Z8751 Personal history of pre-term labor: Secondary | ICD-10-CM | POA: Insufficient documentation

## 2013-06-22 DIAGNOSIS — O9989 Other specified diseases and conditions complicating pregnancy, childbirth and the puerperium: Secondary | ICD-10-CM

## 2013-06-22 DIAGNOSIS — O09219 Supervision of pregnancy with history of pre-term labor, unspecified trimester: Secondary | ICD-10-CM

## 2013-06-22 LAB — POCT URINALYSIS DIP (DEVICE)
BILIRUBIN URINE: NEGATIVE
Glucose, UA: NEGATIVE mg/dL
HGB URINE DIPSTICK: NEGATIVE
Ketones, ur: NEGATIVE mg/dL
Leukocytes, UA: NEGATIVE
NITRITE: NEGATIVE
PH: 6.5 (ref 5.0–8.0)
Protein, ur: NEGATIVE mg/dL
Specific Gravity, Urine: 1.01 (ref 1.005–1.030)
Urobilinogen, UA: 0.2 mg/dL (ref 0.0–1.0)

## 2013-06-22 NOTE — Progress Notes (Deleted)
US for growth done today 

## 2013-06-22 NOTE — Progress Notes (Signed)
Pulse: 86 Had ultrasound this morning. BP elevated in checkin, will repeat during nst.

## 2013-06-22 NOTE — Progress Notes (Signed)
Normal BP, no symptoms. NST performed today was reviewed and was found to be reactive.  Continue recommended antenatal testing and prenatal care.  No other complaints or concerns.  Preeclampsia, fetal movement and labor precautions reviewed.

## 2013-06-22 NOTE — Patient Instructions (Signed)
Return to clinic for any obstetric concerns or go to [facility] for evaluation  

## 2013-06-25 ENCOUNTER — Ambulatory Visit (INDEPENDENT_AMBULATORY_CARE_PROVIDER_SITE_OTHER): Payer: Medicaid Other | Admitting: *Deleted

## 2013-06-25 VITALS — BP 127/74

## 2013-06-25 DIAGNOSIS — O10019 Pre-existing essential hypertension complicating pregnancy, unspecified trimester: Secondary | ICD-10-CM

## 2013-06-25 NOTE — Progress Notes (Signed)
P = 102   IOL scheduled on 07/05/13 @ 0700

## 2013-06-26 ENCOUNTER — Telehealth (HOSPITAL_COMMUNITY): Payer: Self-pay | Admitting: *Deleted

## 2013-06-26 NOTE — Telephone Encounter (Signed)
Preadmission screen  

## 2013-06-29 ENCOUNTER — Telehealth (HOSPITAL_COMMUNITY): Payer: Self-pay | Admitting: *Deleted

## 2013-06-29 ENCOUNTER — Encounter (HOSPITAL_COMMUNITY): Payer: Self-pay | Admitting: *Deleted

## 2013-06-29 ENCOUNTER — Ambulatory Visit (INDEPENDENT_AMBULATORY_CARE_PROVIDER_SITE_OTHER): Payer: Medicaid Other | Admitting: *Deleted

## 2013-06-29 DIAGNOSIS — O10019 Pre-existing essential hypertension complicating pregnancy, unspecified trimester: Secondary | ICD-10-CM

## 2013-06-29 NOTE — Telephone Encounter (Signed)
Preadmission screen  

## 2013-06-29 NOTE — Progress Notes (Signed)
[id number] Micah Flesher- Went to clinic lobby and called pt for her appt. She was not in lobby and I was told by other patients that Ms. Delford FieldWright had left.  Her next scheduled appt is on 2/5 @ 1600.

## 2013-07-02 ENCOUNTER — Ambulatory Visit (INDEPENDENT_AMBULATORY_CARE_PROVIDER_SITE_OTHER): Payer: Medicaid Other | Admitting: *Deleted

## 2013-07-02 VITALS — BP 129/73

## 2013-07-02 DIAGNOSIS — O10019 Pre-existing essential hypertension complicating pregnancy, unspecified trimester: Secondary | ICD-10-CM

## 2013-07-02 NOTE — Progress Notes (Signed)
P = 100    Pt reports decr. FM x3 days.  She felt good FM during NST.  IOL scheduled 2/8.

## 2013-07-04 NOTE — Progress Notes (Signed)
NST 06/25/13 reactive 

## 2013-07-05 ENCOUNTER — Inpatient Hospital Stay (HOSPITAL_COMMUNITY): Payer: Medicaid Other | Admitting: Anesthesiology

## 2013-07-05 ENCOUNTER — Encounter (HOSPITAL_COMMUNITY): Payer: Medicaid Other | Admitting: Anesthesiology

## 2013-07-05 ENCOUNTER — Inpatient Hospital Stay (HOSPITAL_COMMUNITY)
Admission: RE | Admit: 2013-07-05 | Discharge: 2013-07-08 | DRG: 766 | Disposition: A | Discharge status: Home / Self Care | Payer: Medicaid Other | Source: Ambulatory Visit | Attending: Obstetrics & Gynecology | Admitting: Obstetrics & Gynecology

## 2013-07-05 ENCOUNTER — Encounter (HOSPITAL_COMMUNITY): Payer: Self-pay

## 2013-07-05 VITALS — BP 113/76 | HR 91 | Temp 97.9°F | Resp 18 | Ht 63.0 in | Wt 224.0 lb

## 2013-07-05 DIAGNOSIS — O0990 Supervision of high risk pregnancy, unspecified, unspecified trimester: Secondary | ICD-10-CM

## 2013-07-05 DIAGNOSIS — F329 Major depressive disorder, single episode, unspecified: Secondary | ICD-10-CM | POA: Diagnosis present

## 2013-07-05 DIAGNOSIS — O10919 Unspecified pre-existing hypertension complicating pregnancy, unspecified trimester: Secondary | ICD-10-CM | POA: Diagnosis present

## 2013-07-05 DIAGNOSIS — O99344 Other mental disorders complicating childbirth: Secondary | ICD-10-CM | POA: Diagnosis present

## 2013-07-05 DIAGNOSIS — O09219 Supervision of pregnancy with history of pre-term labor, unspecified trimester: Secondary | ICD-10-CM

## 2013-07-05 DIAGNOSIS — O1002 Pre-existing essential hypertension complicating childbirth: Principal | ICD-10-CM | POA: Diagnosis present

## 2013-07-05 DIAGNOSIS — F3289 Other specified depressive episodes: Secondary | ICD-10-CM | POA: Diagnosis present

## 2013-07-05 DIAGNOSIS — O99334 Smoking (tobacco) complicating childbirth: Secondary | ICD-10-CM | POA: Diagnosis present

## 2013-07-05 DIAGNOSIS — O09899 Supervision of other high risk pregnancies, unspecified trimester: Secondary | ICD-10-CM

## 2013-07-05 DIAGNOSIS — F1210 Cannabis abuse, uncomplicated: Secondary | ICD-10-CM | POA: Diagnosis present

## 2013-07-05 LAB — CBC
HCT: 32.4 % — ABNORMAL LOW (ref 36.0–46.0)
Hemoglobin: 10.6 g/dL — ABNORMAL LOW (ref 12.0–15.0)
MCH: 30.8 pg (ref 26.0–34.0)
MCHC: 32.7 g/dL (ref 30.0–36.0)
MCV: 94.2 fL (ref 78.0–100.0)
Platelets: 289 10*3/uL (ref 150–400)
RBC: 3.44 MIL/uL — ABNORMAL LOW (ref 3.87–5.11)
RDW: 13.8 % (ref 11.5–15.5)
WBC: 10 10*3/uL (ref 4.0–10.5)

## 2013-07-05 LAB — COMPREHENSIVE METABOLIC PANEL
ALBUMIN: 2.6 g/dL — AB (ref 3.5–5.2)
ALT: 8 U/L (ref 0–35)
AST: 14 U/L (ref 0–37)
Alkaline Phosphatase: 143 U/L — ABNORMAL HIGH (ref 39–117)
BUN: 7 mg/dL (ref 6–23)
CALCIUM: 9.4 mg/dL (ref 8.4–10.5)
CO2: 19 meq/L (ref 19–32)
Chloride: 105 mEq/L (ref 96–112)
Creatinine, Ser: 0.59 mg/dL (ref 0.50–1.10)
GFR calc Af Amer: >90 mL/min (ref >90)
Glucose, Bld: 89 mg/dL (ref 70–99)
Potassium: 4.1 mEq/L (ref 3.7–5.3)
Sodium: 138 mEq/L (ref 137–147)
TOTAL PROTEIN: 6.3 g/dL (ref 6.0–8.3)
Total Bilirubin: 0.3 mg/dL (ref 0.3–1.2)

## 2013-07-05 LAB — RAPID URINE DRUG SCREEN, HOSP PERFORMED
Amphetamines: NOT DETECTED
BARBITURATES: NOT DETECTED
Benzodiazepines: NOT DETECTED
Cocaine: NOT DETECTED
OPIATES: NOT DETECTED
TETRAHYDROCANNABINOL: NOT DETECTED

## 2013-07-05 LAB — RPR: RPR: NONREACTIVE

## 2013-07-05 LAB — TYPE AND SCREEN
ABO/RH(D): O POS
ANTIBODY SCREEN: NEGATIVE

## 2013-07-05 MED ORDER — LACTATED RINGERS IV SOLN
INTRAVENOUS | Status: DC
  Administered 2013-07-05 – 2013-07-06 (×2): via INTRAVENOUS

## 2013-07-05 MED ORDER — ONDANSETRON HCL 4 MG/2ML IJ SOLN: 4.0000 mg | Freq: Four times a day (QID) | INTRAMUSCULAR | Status: DC | PRN

## 2013-07-05 MED ORDER — OXYTOCIN 40 UNITS IN LACTATED RINGERS INFUSION - SIMPLE MED: 62.5000 mL/h | INTRAVENOUS | Status: DC

## 2013-07-05 MED ORDER — FENTANYL CITRATE 0.05 MG/ML IJ SOLN
100.0000 ug | Freq: Once | INTRAMUSCULAR | Status: AC
  Administered 2013-07-05: 100 ug via INTRAVENOUS
  Filled 2013-07-05: qty 2

## 2013-07-05 MED ORDER — IBUPROFEN 600 MG PO TABS: 600.0000 mg | ORAL_TABLET | Freq: Four times a day (QID) | ORAL | Status: DC | PRN

## 2013-07-05 MED ORDER — OXYTOCIN BOLUS FROM INFUSION: 500.0000 mL | INTRAVENOUS | Status: DC

## 2013-07-05 MED ORDER — CITRIC ACID-SODIUM CITRATE 334-500 MG/5ML PO SOLN
30.0000 mL | ORAL | Status: DC | PRN
  Administered 2013-07-06: 30 mL via ORAL
  Filled 2013-07-05: qty 15

## 2013-07-05 MED ORDER — EPHEDRINE 5 MG/ML INJ
10.0000 mg | INTRAVENOUS | Status: DC | PRN
  Filled 2013-07-05: qty 4

## 2013-07-05 MED ORDER — LIDOCAINE HCL (PF) 1 % IJ SOLN
INTRAMUSCULAR | Status: DC | PRN
  Administered 2013-07-05 (×3): 5 mL

## 2013-07-05 MED ORDER — OXYTOCIN 40 UNITS IN LACTATED RINGERS INFUSION - SIMPLE MED
1.0000 m[IU]/min | INTRAVENOUS | Status: DC
  Administered 2013-07-05: 2 m[IU]/min via INTRAVENOUS
  Filled 2013-07-05: qty 1000

## 2013-07-05 MED ORDER — OXYCODONE-ACETAMINOPHEN 5-325 MG PO TABS: 1.0000 | ORAL_TABLET | ORAL | Status: DC | PRN

## 2013-07-05 MED ORDER — LACTATED RINGERS IV SOLN
500.0000 mL | Freq: Once | INTRAVENOUS | Status: AC
  Administered 2013-07-05: 13:00:00 via INTRAVENOUS

## 2013-07-05 MED ORDER — LIDOCAINE HCL (PF) 1 % IJ SOLN
30.0000 mL | INTRAMUSCULAR | Status: DC | PRN
  Filled 2013-07-05: qty 30

## 2013-07-05 MED ORDER — EPHEDRINE 5 MG/ML INJ
10.0000 mg | INTRAVENOUS | Status: DC | PRN
Start: 2013-07-05 — End: 2013-07-06
  Administered 2013-07-05: 10 mg via INTRAVENOUS

## 2013-07-05 MED ORDER — TERBUTALINE SULFATE 1 MG/ML IJ SOLN: 0.2500 mg | Freq: Once | INTRAMUSCULAR | Status: AC | PRN

## 2013-07-05 MED ORDER — ACETAMINOPHEN 325 MG PO TABS: 650.0000 mg | ORAL_TABLET | ORAL | Status: DC | PRN

## 2013-07-05 MED ORDER — PHENYLEPHRINE 40 MCG/ML (10ML) SYRINGE FOR IV PUSH (FOR BLOOD PRESSURE SUPPORT)
80.0000 ug | PREFILLED_SYRINGE | INTRAVENOUS | Status: AC | PRN
Start: 2013-07-05 — End: 2013-07-05
  Administered 2013-07-05: 40 ug via INTRAVENOUS
  Administered 2013-07-05 (×2): 80 ug via INTRAVENOUS

## 2013-07-05 MED ORDER — LACTATED RINGERS IV SOLN: 500.0000 mL | INTRAVENOUS | Status: DC | PRN

## 2013-07-05 MED ORDER — DIPHENHYDRAMINE HCL 50 MG/ML IJ SOLN: 12.5000 mg | INTRAMUSCULAR | Status: DC | PRN

## 2013-07-05 MED ORDER — FENTANYL 2.5 MCG/ML BUPIVACAINE 1/10 % EPIDURAL INFUSION (WH - ANES)
INTRAMUSCULAR | Status: DC | PRN
  Administered 2013-07-05: 14 mL/h via EPIDURAL

## 2013-07-05 MED ORDER — PHENYLEPHRINE 40 MCG/ML (10ML) SYRINGE FOR IV PUSH (FOR BLOOD PRESSURE SUPPORT)
80.0000 ug | PREFILLED_SYRINGE | INTRAVENOUS | Status: DC | PRN
  Filled 2013-07-05: qty 10

## 2013-07-05 MED ORDER — FENTANYL 2.5 MCG/ML BUPIVACAINE 1/10 % EPIDURAL INFUSION (WH - ANES)
14.0000 mL/h | INTRAMUSCULAR | Status: DC | PRN
  Administered 2013-07-05 – 2013-07-06 (×2): 14 mL/h via EPIDURAL
  Filled 2013-07-05 (×3): qty 125

## 2013-07-05 NOTE — H&P (Signed)
HPI: Shirley Tran is a 23 y.o. year old [id number] female at 2880w0d weeks gestation by 10 week US who [facility] Denies HA, vision changes, epigastric pain, LOF, VB. Pos FM.   Does not have custody of three other children. Pt states this is due to depression, but previous UDS's are pos for cocaine, THC.   [facility]  Genetic Screen  Quad 01/22/13 normal  Anatomic US  WNL cont twice weekly NST/ and weekly AFI  Glucose Screen 105  TDaP vaccine  05/19/13  Flu vaccine  05/19/13  GBS  Negative  Baby Food  Breastfeeding  Contraception Mirena  Circumcision  desires outpatient   Patient Active Problem List   Diagnosis Date Noted  . Chronic hypertension complicating or reason for care during pregnancy 07/05/2013  . Marijuana use 01/22/2013  . Supervision of high-risk pregnancy 12/25/2012  . Hx of preterm delivery, currently pregnant 12/25/2012  . Benign essential hypertension antepartum 12/25/2012    Maternal Medical History:  Reason for admission: Nausea.    OB History   Grav Para Term Preterm Abortions TAB SAB Ect Mult Living   6 3 2 1 2 1 1  0 0 3     Past Medical History  Diagnosis Date  . Chlamydia 2006    treated  . Candidiasis 2010    treatment  . Cystitis   . Reflux   . Seizures 2012    only had 1 seizure  . Hypertension     out of meds, no PCP  . Complication of anesthesia     back pain  . Postpartum depression     states depression all the time, worse postpartum   Past Surgical History  Procedure Laterality Date  . Dilation and curettage of uterus     Family History: family history includes Anemia in her mother; Diabetes in her maternal grandmother; Heart disease in her maternal grandmother; Sickle cell trait in her daughter; Thrombophlebitis in her maternal grandmother. Social History:  reports that she has been smoking Cigarettes.  She has a 1.75 pack-year smoking history. She has never used smokeless tobacco. She  reports that she does not drink alcohol or use illicit drugs.   Prenatal Transfer Tool  Maternal Diabetes: No Genetic Screening: Normal Maternal Ultrasounds/Referrals: Normal Fetal Ultrasounds or other Referrals:  None Maternal Substance Abuse:  Yes:  Type: Smoker, Marijuana Significant Maternal Medications:  None Significant Maternal Lab Results:  Lab values include: Group B Strep negative Other Comments:  Does not have custody of three other children,. Hx cocaine in 2012. THC pos this pregnancy. UDS neg. CHTN.  Review of Systems  Constitutional: Negative for fever and chills.  Eyes: Negative for blurred vision and double vision.  Cardiovascular: Negative for chest pain.  Gastrointestinal: Negative for nausea, vomiting and abdominal pain.  Neurological: Negative for headaches.    07/05/13 0921 130/77 mmHg - 90 - - - -  07/05/13 0805 137/86 mmHg 98.4 F (36.9 C) 103 18 - - -   Dilation: 2 Effacement (%): 50 Station: -3 Exam by:: v. Kashay Cavenaugh, cnm Blood pressure 123/69, pulse 89, temperature 98.4 F (36.9 C), resp. rate 18, last menstrual period 10/29/2012. Maternal Exam:  Uterine Assessment: Contraction strength is mild.  Contraction frequency is irregular.   Abdomen: Fundal height is S=D.   Fetal presentation: vertex  Introitus: Normal vulva. Pelvis: adequate for delivery.   Cervix: Cervix evaluated by digital exam.     Fetal Exam Fetal  Monitor Review: Mode: ultrasound.   Baseline rate: 140.  Variability: moderate (6-25 bpm).   Pattern: accelerations present and no decelerations.    Fetal State Assessment: Category I - tracings are normal.     Physical Exam  Nursing note and vitals reviewed. Constitutional: She is oriented to person, place, and time. She appears well-developed and well-nourished. No distress.  HENT:  Head: Normocephalic.  Eyes: Conjunctivae are normal.  Cardiovascular: Normal rate, regular rhythm and normal heart sounds.   Respiratory: Effort  normal and breath sounds normal.  GI: Soft. There is no tenderness.  Genitourinary: Vagina normal.  Musculoskeletal: She exhibits edema (+1). She exhibits no tenderness.  Neurological: She is alert and oriented to person, place, and time. She has normal reflexes.  Skin: Skin is warm and dry.  Psychiatric: She has a normal mood and affect.    Prenatal labs: ABO, Rh: --/--/O POS (02/08 0800) Antibody: NEG (02/08 0800) Rubella: 5.73 (07/31 0946) RPR: NON REAC (12/23 1223)  HBsAg: NEGATIVE (07/31 0946)  HIV: NON REACTIVE (12/23 1223)  GBS: Negative (01/12 0000)  1 hour GTT 105 Quad screen normal  Assessment: 1. Labor: IOL 2. Fetal Wellbeing: Category I  3. Pain Control: None 4. GBS: Neg 5. 40.0 week IUP 6. CHTN w/out evidence of superimposed Preeclampsia  Plan:  1. Admit to BS per consult with MD 2. Routine L&D orders 3. Analgesia/anesthesia PRN  4. Pitocin 5. PIH labs 6. UDS 7. SW consult PP  Emilo Gras 07/05/2013, 12:26 PM

## 2013-07-05 NOTE — Progress Notes (Signed)
Shirley Tran is a 23 y.o. [id number] at 6477w0d.   Subjective: Pelvic pressure w/ UC's.  Objective: BP 95/36, 129/109  Pulse 93  Temp(Src) 98.5 F (36.9 C)  Resp 18  Ht 5\' 3"  (1.6 m)  Wt 101.606 kg (224 lb)  BMI 39.69 kg/m2  SpO2 100%  LMP 10/29/2012      FHT:  FHR: 135 bpm, variability: moderate,  accelerations:  Present,  decelerations:  Present early, bvariable and few late decels UC:   regular, every 2-3 minutes SVE:   Dilation: 7 Effacement (%): 60 Station: 0 Exam by:: Shirley Tran, Shirley Tran, Shirley Tran AROM moderate amount of clear fluid.  Labs: Lab Results  Component Value Date   WBC 10.0 07/05/2013   HGB 10.6* 07/05/2013   HCT 32.4* 07/05/2013   MCV 94.2 07/05/2013   PLT 289 07/05/2013    Assessment / Plan: Induction of labor due to gestational hypertension,  progressing well on pitocin  Labor: Progressing normally Preeclampsia:  labs stable Fetal Wellbeing:  Category II Pain Control:  Epidural I/D:  n/a Anticipated MOD:  NSVD  Shirley Tran 07/05/2013, 4:46 PM

## 2013-07-05 NOTE — Anesthesia Procedure Notes (Signed)
Epidural Patient location during procedure: OB  Staffing Anesthesiologist: Alilah Mcmeans Performed by: anesthesiologist   Preanesthetic Checklist Completed: patient identified, site marked, surgical consent, pre-op evaluation, timeout performed, IV checked, risks and benefits discussed and monitors and equipment checked  Epidural Patient position: sitting Prep: ChloraPrep Patient monitoring: heart rate, continuous pulse ox and blood pressure Approach: right paramedian Injection technique: LOR saline  Needle:  Needle type: Tuohy  Needle gauge: 17 G Needle length: 9 cm and 9 Needle insertion depth: 7 cm Catheter type: closed end flexible Catheter size: 20 Guage Catheter at skin depth: 12 cm Test dose: negative  Assessment Events: blood not aspirated, injection not painful, no injection resistance, negative IV test and no paresthesia  Additional Notes   Patient tolerated the insertion well without complications.   

## 2013-07-05 NOTE — Anesthesia Preprocedure Evaluation (Addendum)
Anesthesia Evaluation  Patient identified by MRN, date of birth, ID band Patient awake    Reviewed: Allergy & Precautions, H&P , NPO status , Patient's Chart, lab work & pertinent test results  History of Anesthesia Complications Negative for: history of anesthetic complications  Airway Mallampati: II TM Distance: >3 FB Neck ROM: full    Dental no notable dental hx. (+) Teeth Intact   Pulmonary neg pulmonary ROS, Current Smoker,  breath sounds clear to auscultation  Pulmonary exam normal       Cardiovascular hypertension, negative cardio ROS  Rhythm:regular Rate:Normal     Neuro/Psych negative neurological ROS  negative psych ROS   GI/Hepatic negative GI ROS, Neg liver ROS,   Endo/Other  negative endocrine ROS  Renal/GU negative Renal ROS  negative genitourinary   Musculoskeletal   Abdominal Normal abdominal exam  (+)   Peds  Hematology negative hematology ROS (+)   Anesthesia Other Findings   Reproductive/Obstetrics (+) Pregnancy                          Anesthesia Physical Anesthesia Plan  ASA: II  Anesthesia Plan: Epidural   Post-op Pain Management:    Induction:   Airway Management Planned:   Additional Equipment:   Intra-op Plan:   Post-operative Plan:   Informed Consent: I have reviewed the patients History and Physical, chart, labs and discussed the procedure including the risks, benefits and alternatives for the proposed anesthesia with the patient or authorized representative who has indicated his/her understanding and acceptance.     Plan Discussed with:   Anesthesia Plan Comments:         Anesthesia Quick Evaluation

## 2013-07-05 NOTE — Progress Notes (Signed)
Shirley Tran is a 23 y.o. [id number] at 6518w0d.  Subjective: Comfortable w/ epidural.   Objective: BP 106/74  Pulse 97  Temp(Src) 98.5 F (36.9 C)  Resp 18  Ht 5\' 3"  (1.6 m)  Wt 101.606 kg (224 lb)  BMI 39.69 kg/m2  SpO2 100%  LMP 10/29/2012      FHT:  FHR: 145 bpm, variability: moderate,  accelerations:  Present,  decelerations:  Absent UC:   regular, every 2-3 minutes SVE:   Dilation: 4 Effacement (%): 50 Station: -2 Exam by:: lee  Labs: Lab Results  Component Value Date   WBC 10.0 07/05/2013   HGB 10.6* 07/05/2013   HCT 32.4* 07/05/2013   MCV 94.2 07/05/2013   PLT 289 07/05/2013    Assessment / Plan: Induction of labor due [facility],  progressing well on pitocin  Labor: Progressing on Pitocin, will continue to increase then AROM Preeclampsia:  labs stable Fetal Wellbeing:  Category I Pain Control:  Epidural I/D:  n/a Anticipated MOD:  NSVD  Eliabeth Shoff 07/05/2013, 3:28 PM

## 2013-07-05 NOTE — Progress Notes (Signed)
Shirley Tran is a 23 y.o. [id number] at 155w0d.   Subjective: Pelvic pressure w/ UC's. Less since Fentanyl IV.   Objective: BP 107/52  Pulse 93  Temp(Src) 98.5 F (36.9 C)  Resp 18  Ht 5\' 3"  (1.6 m)  Wt 101.606 kg (224 lb)  BMI 39.69 kg/m2  SpO2 100%  LMP 10/29/2012      FHT:  FHR: 140 bpm, variability: moderate,  accelerations:  Present,  decelerations:  Present Variable and late decels. Resolved w/ D/C of pitocin.  UC:   regular, every 2-3 minutes SVE:   Dilation: 7 Effacement (%): 100 Station: 0 Exam by:: RN  Labs: Lab Results  Component Value Date   WBC 10.0 07/05/2013   HGB 10.6* 07/05/2013   HCT 32.4* 07/05/2013   MCV 94.2 07/05/2013   PLT 289 07/05/2013    Assessment / Plan: Protracted active phase  Labor: Active labor. Preeclampsia:  labs stable Fetal Wellbeing:  Category I and II Pain Control:  Epidural and Fentanyl I/D:  n/a Anticipated MOD:  NSVD Leave pitocin off for now. If no change at next exam will place IUPC.   Shirley Tran, Shirley Tran 07/05/2013, 6:49 PM

## 2013-07-05 NOTE — Progress Notes (Signed)
Shirley Tran is a 23 y.o. [id number] at 5339w0d.   Subjective: Pelvic pressure w/ UC's. Less since Fentanyl IV. Pt was evaluated at approximately 9pm. Called to delivery prior to physician exam. Repeat exam now at 1045.  Objective: BP 137/88  Pulse 113  Temp(Src) 98.8 F (37.1 C) (Oral)  Resp 16  Ht 5\' 3"  (1.6 m)  Wt 101.606 kg (224 lb)  BMI 39.69 kg/m2  SpO2 100%  LMP 10/29/2012      FHT:  FHR: 150s bpm, variability: moderate,  accelerations:  Abscent,  decelerations:  Present Variable and late decels. attempting repositioning Dilation: 7 Effacement (%): 60 Cervical Position: Middle Station: -2 Presentation: Vertex Exam by:: Dr. Ike Benedom   Labs: Lab Results  Component Value Date   WBC 10.0 07/05/2013   HGB 10.6* 07/05/2013   HCT 32.4* 07/05/2013   MCV 94.2 07/05/2013   PLT 289 07/05/2013    Assessment / Plan: Protracted active phase  Labor: No significant change on repeat exam. IUPC placed, will restart pit if able based on fetal tolerance. Preeclampsia:  labs stable Fetal Wellbeing:  Category II Pain Control:  Epidural and Fentanyl I/D:  n/a Anticipated MOD:  NSVD Restart pitocin as no change from prior exam.   Shirley Tran 07/05/2013, 10:56 PM

## 2013-07-06 ENCOUNTER — Encounter (HOSPITAL_COMMUNITY): Payer: Self-pay

## 2013-07-06 ENCOUNTER — Encounter (HOSPITAL_COMMUNITY)
Admission: RE | Disposition: A | Discharge status: Home / Self Care | Payer: Self-pay | Source: Ambulatory Visit | Attending: Obstetrics & Gynecology

## 2013-07-06 DIAGNOSIS — F1210 Cannabis abuse, uncomplicated: Secondary | ICD-10-CM

## 2013-07-06 DIAGNOSIS — O1002 Pre-existing essential hypertension complicating childbirth: Secondary | ICD-10-CM

## 2013-07-06 SURGERY — Surgical Case
Anesthesia: Epidural

## 2013-07-06 MED ORDER — NALOXONE HCL 0.4 MG/ML IJ SOLN: 0.4000 mg | INTRAMUSCULAR | Status: DC | PRN

## 2013-07-06 MED ORDER — KETOROLAC TROMETHAMINE 60 MG/2ML IM SOLN
60.0000 mg | Freq: Once | INTRAMUSCULAR | Status: AC | PRN
Start: 2013-07-06 — End: 2013-07-06
  Administered 2013-07-06: 60 mg via INTRAMUSCULAR

## 2013-07-06 MED ORDER — MEPERIDINE HCL 25 MG/ML IJ SOLN: 6.2500 mg | INTRAMUSCULAR | Status: DC | PRN

## 2013-07-06 MED ORDER — SODIUM BICARBONATE 8.4 % IV SOLN
INTRAVENOUS | Status: DC | PRN
  Administered 2013-07-06: 10 mL via EPIDURAL
  Administered 2013-07-06: 5 mL via EPIDURAL

## 2013-07-06 MED ORDER — ONDANSETRON HCL 4 MG/2ML IJ SOLN: 4.0000 mg | INTRAMUSCULAR | Status: DC | PRN

## 2013-07-06 MED ORDER — SODIUM CHLORIDE 0.9 % IJ SOLN
INTRAMUSCULAR | Status: AC
  Filled 2013-07-06: qty 50

## 2013-07-06 MED ORDER — TETANUS-DIPHTH-ACELL PERTUSSIS 5-2.5-18.5 LF-MCG/0.5 IM SUSP: 0.5000 mL | Freq: Once | INTRAMUSCULAR | Status: DC

## 2013-07-06 MED ORDER — OXYTOCIN 40 UNITS IN LACTATED RINGERS INFUSION - SIMPLE MED
62.5000 mL/h | INTRAVENOUS | Status: AC
Start: 2013-07-06 — End: 2013-07-06

## 2013-07-06 MED ORDER — MEPERIDINE HCL 25 MG/ML IJ SOLN
INTRAMUSCULAR | Status: DC | PRN
  Administered 2013-07-06 (×2): 12.5 mg via INTRAVENOUS

## 2013-07-06 MED ORDER — SENNOSIDES-DOCUSATE SODIUM 8.6-50 MG PO TABS
2.0000 | ORAL_TABLET | ORAL | Status: DC
  Administered 2013-07-06 – 2013-07-07 (×2): 2 via ORAL
  Filled 2013-07-06 (×2): qty 2

## 2013-07-06 MED ORDER — FENTANYL CITRATE 0.05 MG/ML IJ SOLN: 25.0000 ug | INTRAMUSCULAR | Status: DC | PRN

## 2013-07-06 MED ORDER — BISACODYL 10 MG RE SUPP: 10.0000 mg | Freq: Every day | RECTAL | Status: DC | PRN

## 2013-07-06 MED ORDER — SIMETHICONE 80 MG PO CHEW
80.0000 mg | CHEWABLE_TABLET | Freq: Three times a day (TID) | ORAL | Status: DC
  Administered 2013-07-06 – 2013-07-08 (×6): 80 mg via ORAL
  Filled 2013-07-06 (×7): qty 1

## 2013-07-06 MED ORDER — PRENATAL MULTIVITAMIN CH
1.0000 | ORAL_TABLET | Freq: Every day | ORAL | Status: DC
  Administered 2013-07-07 – 2013-07-08 (×2): 1 via ORAL
  Filled 2013-07-06 (×2): qty 1

## 2013-07-06 MED ORDER — DIPHENHYDRAMINE HCL 50 MG/ML IJ SOLN: 12.5000 mg | INTRAMUSCULAR | Status: DC | PRN

## 2013-07-06 MED ORDER — CEFAZOLIN SODIUM-DEXTROSE 2-3 GM-% IV SOLR
INTRAVENOUS | Status: AC
  Filled 2013-07-06: qty 50

## 2013-07-06 MED ORDER — CEFAZOLIN (ANCEF) 1 G IV SOLR
2.0000 g | INTRAVENOUS | Status: DC
  Administered 2013-07-06: 2 g

## 2013-07-06 MED ORDER — IBUPROFEN 600 MG PO TABS
600.0000 mg | ORAL_TABLET | Freq: Four times a day (QID) | ORAL | Status: DC
  Administered 2013-07-06 – 2013-07-08 (×7): 600 mg via ORAL
  Filled 2013-07-06 (×7): qty 1

## 2013-07-06 MED ORDER — BUPIVACAINE LIPOSOME 1.3 % IJ SUSP
20.0000 mL | Freq: Once | INTRAMUSCULAR | Status: DC
Start: 2013-07-06 — End: 2013-07-06
  Filled 2013-07-06: qty 20

## 2013-07-06 MED ORDER — LACTATED RINGERS IV SOLN
INTRAVENOUS | Status: DC
  Administered 2013-07-06 (×2): via INTRAVENOUS

## 2013-07-06 MED ORDER — KETOROLAC TROMETHAMINE 30 MG/ML IJ SOLN
30.0000 mg | Freq: Four times a day (QID) | INTRAMUSCULAR | Status: AC | PRN
  Administered 2013-07-06: 30 mg via INTRAVENOUS
  Filled 2013-07-06: qty 1

## 2013-07-06 MED ORDER — OXYCODONE-ACETAMINOPHEN 5-325 MG PO TABS
1.0000 | ORAL_TABLET | ORAL | Status: DC | PRN
  Administered 2013-07-06: 1 via ORAL
  Administered 2013-07-07: 2 via ORAL
  Administered 2013-07-07: 1 via ORAL
  Administered 2013-07-07 – 2013-07-08 (×4): 2 via ORAL
  Filled 2013-07-06: qty 1
  Filled 2013-07-06 (×2): qty 2
  Filled 2013-07-06 (×2): qty 1
  Filled 2013-07-06 (×3): qty 2

## 2013-07-06 MED ORDER — MORPHINE SULFATE (PF) 0.5 MG/ML IJ SOLN
INTRAMUSCULAR | Status: DC | PRN
  Administered 2013-07-06: 4 mg via EPIDURAL

## 2013-07-06 MED ORDER — MORPHINE SULFATE 0.5 MG/ML IJ SOLN
INTRAMUSCULAR | Status: AC
  Filled 2013-07-06: qty 10

## 2013-07-06 MED ORDER — MORPHINE SULFATE (PF) 0.5 MG/ML IJ SOLN
INTRAMUSCULAR | Status: DC | PRN
  Administered 2013-07-06 (×2): .5 mg via EPIDURAL

## 2013-07-06 MED ORDER — WITCH HAZEL-GLYCERIN EX PADS
1.0000 | MEDICATED_PAD | CUTANEOUS | Status: DC | PRN
Start: 2013-07-06 — End: 2013-07-08

## 2013-07-06 MED ORDER — ONDANSETRON HCL 4 MG/2ML IJ SOLN
INTRAMUSCULAR | Status: AC
  Filled 2013-07-06: qty 2

## 2013-07-06 MED ORDER — DIBUCAINE 1 % RE OINT: 1.0000 "application " | TOPICAL_OINTMENT | RECTAL | Status: DC | PRN

## 2013-07-06 MED ORDER — OXYTOCIN 10 UNIT/ML IJ SOLN
40.0000 [IU] | INTRAVENOUS | Status: DC | PRN
  Administered 2013-07-06: 40 [IU] via INTRAVENOUS

## 2013-07-06 MED ORDER — ONDANSETRON HCL 4 MG/2ML IJ SOLN: 4.0000 mg | Freq: Three times a day (TID) | INTRAMUSCULAR | Status: DC | PRN

## 2013-07-06 MED ORDER — KETOROLAC TROMETHAMINE 30 MG/ML IJ SOLN: 30.0000 mg | Freq: Four times a day (QID) | INTRAMUSCULAR | Status: AC | PRN

## 2013-07-06 MED ORDER — OXYTOCIN 10 UNIT/ML IJ SOLN
INTRAMUSCULAR | Status: AC
  Filled 2013-07-06: qty 4

## 2013-07-06 MED ORDER — METOCLOPRAMIDE HCL 5 MG/ML IJ SOLN
10.0000 mg | Freq: Three times a day (TID) | INTRAMUSCULAR | Status: DC | PRN
  Administered 2013-07-06: 10 mg via INTRAVENOUS
  Filled 2013-07-06: qty 2

## 2013-07-06 MED ORDER — BUPIVACAINE LIPOSOME 1.3 % IJ SUSP
INTRAMUSCULAR | Status: DC | PRN
  Administered 2013-07-06: 20 mL

## 2013-07-06 MED ORDER — NALBUPHINE HCL 10 MG/ML IJ SOLN: 5.0000 mg | INTRAMUSCULAR | Status: DC | PRN

## 2013-07-06 MED ORDER — DIPHENHYDRAMINE HCL 25 MG PO CAPS: 25.0000 mg | ORAL_CAPSULE | Freq: Four times a day (QID) | ORAL | Status: DC | PRN

## 2013-07-06 MED ORDER — MENTHOL 3 MG MT LOZG: 1.0000 | LOZENGE | OROMUCOSAL | Status: DC | PRN

## 2013-07-06 MED ORDER — SODIUM CHLORIDE 0.9 % IJ SOLN
INTRAMUSCULAR | Status: DC | PRN
  Administered 2013-07-06: 20 mL via INTRAVENOUS

## 2013-07-06 MED ORDER — LACTATED RINGERS IV SOLN: INTRAVENOUS | Status: DC

## 2013-07-06 MED ORDER — MEPERIDINE HCL 25 MG/ML IJ SOLN
INTRAMUSCULAR | Status: AC
  Filled 2013-07-06: qty 1

## 2013-07-06 MED ORDER — SIMETHICONE 80 MG PO CHEW
80.0000 mg | CHEWABLE_TABLET | ORAL | Status: DC
  Administered 2013-07-08: 80 mg via ORAL
  Filled 2013-07-06 (×2): qty 1

## 2013-07-06 MED ORDER — SCOPOLAMINE 1 MG/3DAYS TD PT72
MEDICATED_PATCH | TRANSDERMAL | Status: AC
  Filled 2013-07-06: qty 1

## 2013-07-06 MED ORDER — NALOXONE HCL 1 MG/ML IJ SOLN
1.0000 ug/kg/h | INTRAVENOUS | Status: DC | PRN
  Filled 2013-07-06: qty 2

## 2013-07-06 MED ORDER — SODIUM CHLORIDE 0.9 % IJ SOLN: 3.0000 mL | INTRAMUSCULAR | Status: DC | PRN

## 2013-07-06 MED ORDER — KETOROLAC TROMETHAMINE 60 MG/2ML IM SOLN
INTRAMUSCULAR | Status: AC
  Administered 2013-07-06: 60 mg via INTRAMUSCULAR
  Filled 2013-07-06: qty 2

## 2013-07-06 MED ORDER — PHENYLEPHRINE HCL 10 MG/ML IJ SOLN
INTRAMUSCULAR | Status: DC | PRN
  Administered 2013-07-06 (×3): 80 ug via INTRAVENOUS
  Administered 2013-07-06: 40 ug via INTRAVENOUS
  Administered 2013-07-06: 80 ug via INTRAVENOUS
  Administered 2013-07-06: 40 ug via INTRAVENOUS
  Administered 2013-07-06: 80 ug via INTRAVENOUS
  Administered 2013-07-06: 120 ug via INTRAVENOUS

## 2013-07-06 MED ORDER — DIPHENHYDRAMINE HCL 25 MG PO CAPS: 25.0000 mg | ORAL_CAPSULE | ORAL | Status: DC | PRN

## 2013-07-06 MED ORDER — FLEET ENEMA 7-19 GM/118ML RE ENEM: 1.0000 | ENEMA | Freq: Every day | RECTAL | Status: DC | PRN

## 2013-07-06 MED ORDER — SIMETHICONE 80 MG PO CHEW
80.0000 mg | CHEWABLE_TABLET | ORAL | Status: DC | PRN
  Administered 2013-07-06 – 2013-07-08 (×2): 80 mg via ORAL
  Filled 2013-07-06: qty 1

## 2013-07-06 MED ORDER — DIPHENHYDRAMINE HCL 50 MG/ML IJ SOLN: 25.0000 mg | INTRAMUSCULAR | Status: DC | PRN

## 2013-07-06 MED ORDER — ONDANSETRON HCL 4 MG PO TABS: 4.0000 mg | ORAL_TABLET | ORAL | Status: DC | PRN

## 2013-07-06 MED ORDER — SCOPOLAMINE 1 MG/3DAYS TD PT72
1.0000 | MEDICATED_PATCH | Freq: Once | TRANSDERMAL | Status: DC
  Administered 2013-07-06: 1.5 mg via TRANSDERMAL

## 2013-07-06 MED ORDER — ONDANSETRON HCL 4 MG/2ML IJ SOLN
INTRAMUSCULAR | Status: DC | PRN
  Administered 2013-07-06: 4 mg via INTRAVENOUS

## 2013-07-06 MED ORDER — ZOLPIDEM TARTRATE 5 MG PO TABS: 5.0000 mg | ORAL_TABLET | Freq: Every evening | ORAL | Status: DC | PRN

## 2013-07-06 MED ORDER — LACTATED RINGERS IV SOLN
INTRAVENOUS | Status: DC
  Administered 2013-07-06: 02:00:00 via INTRAUTERINE

## 2013-07-06 MED ORDER — LANOLIN HYDROUS EX OINT: 1.0000 "application " | TOPICAL_OINTMENT | CUTANEOUS | Status: DC | PRN

## 2013-07-06 SURGICAL SUPPLY — 38 items
CLAMP CORD UMBIL (MISCELLANEOUS) IMPLANT
CLOTH BEACON ORANGE TIMEOUT ST (SAFETY) ×3 IMPLANT
DERMABOND ADVANCED (GAUZE/BANDAGES/DRESSINGS) ×4
DERMABOND ADVANCED .7 DNX12 (GAUZE/BANDAGES/DRESSINGS) ×2 IMPLANT
DRAPE LG THREE QUARTER DISP (DRAPES) ×3 IMPLANT
DRSG OPSITE POSTOP 4X10 (GAUZE/BANDAGES/DRESSINGS) ×3 IMPLANT
DURAPREP 26ML APPLICATOR (WOUND CARE) ×6 IMPLANT
ELECT REM PT RETURN 9FT ADLT (ELECTROSURGICAL) ×3
ELECTRODE REM PT RTRN 9FT ADLT (ELECTROSURGICAL) ×1 IMPLANT
EXTRACTOR VACUUM BELL STYLE (SUCTIONS) IMPLANT
GLOVE BIOGEL PI IND STRL 8 (GLOVE) ×1 IMPLANT
GLOVE BIOGEL PI INDICATOR 8 (GLOVE) ×2
GLOVE ECLIPSE 8.0 STRL XLNG CF (GLOVE) ×3 IMPLANT
GOWN STRL REUS W/ TWL XL LVL3 (GOWN DISPOSABLE) ×1 IMPLANT
GOWN STRL REUS W/TWL LRG LVL3 (GOWN DISPOSABLE) ×3 IMPLANT
GOWN STRL REUS W/TWL XL LVL3 (GOWN DISPOSABLE) ×2
KIT ABG SYR 3ML LUER SLIP (SYRINGE) IMPLANT
NEEDLE HYPO 18GX1.5 BLUNT FILL (NEEDLE) ×3 IMPLANT
NEEDLE HYPO 22GX1.5 SAFETY (NEEDLE) ×3 IMPLANT
NEEDLE HYPO 25X5/8 SAFETYGLIDE (NEEDLE) ×3 IMPLANT
NS IRRIG 1000ML POUR BTL (IV SOLUTION) ×3 IMPLANT
PACK C SECTION WH (CUSTOM PROCEDURE TRAY) ×3 IMPLANT
PAD OB MATERNITY 4.3X12.25 (PERSONAL CARE ITEMS) ×3 IMPLANT
RTRCTR C-SECT PINK 25CM LRG (MISCELLANEOUS) IMPLANT
STAPLER VISISTAT 35W (STAPLE) IMPLANT
SUT CHROMIC 0 CT 1 (SUTURE) ×3 IMPLANT
SUT MNCRL 0 VIOLET CTX 36 (SUTURE) ×2 IMPLANT
SUT MONOCRYL 0 CTX 36 (SUTURE) ×4
SUT PLAIN 2 0 (SUTURE)
SUT PLAIN 2 0 XLH (SUTURE) IMPLANT
SUT PLAIN ABS 2-0 CT1 27XMFL (SUTURE) IMPLANT
SUT VIC AB 0 CTX 36 (SUTURE) ×2
SUT VIC AB 0 CTX36XBRD ANBCTRL (SUTURE) ×1 IMPLANT
SUT VIC AB 4-0 KS 27 (SUTURE) ×3 IMPLANT
SYR 20CC LL (SYRINGE) ×6 IMPLANT
TOWEL OR 17X24 6PK STRL BLUE (TOWEL DISPOSABLE) ×3 IMPLANT
TRAY FOLEY CATH 14FR (SET/KITS/TRAYS/PACK) IMPLANT
WATER STERILE IRR 1000ML POUR (IV SOLUTION) IMPLANT

## 2013-07-06 NOTE — Transfer of Care (Signed)
Immediate Anesthesia Transfer of Care Note  Patient: Shirley Tran  Procedure(s) Performed: Procedure(s): Primary Cesarean Section Delivery Baby Boy@ [phone], Apgars 9/9 (N/A)  Patient Location: PACU  Anesthesia Type:Epidural  Level of Consciousness: awake, alert  and oriented  Airway & Oxygen Therapy: Patient Spontanous Breathing  Post-op Assessment: Report given to PACU RN and Post -op Vital signs reviewed and stable  Post vital signs: Reviewed and stable  Complications: No apparent anesthesia complications

## 2013-07-06 NOTE — Op Note (Signed)
Shirley Tran PROCEDURE DATE: 07/05/2013 - 07/06/2013   PREOPERATIVE DIAGNOSES: Intrauterine pregnancy at  9139w1d weeks gestation; failure to progress: arrest of dilation  POSTOPERATIVE DIAGNOSES: The same  PROCEDURE: Primary Low Transverse Cesarean Section  SURGEON:  Dr. Despina HiddenEure  ASSISTANT:  Dr. Ike Benedom  INDICATIONS: Shirley Tran is a 23 y.o. Z6X0960G6P2123 at 7639w1d here for cesarean section secondary to the indications listed under preoperative diagnoses; please see preoperative note for further details.  The risks of cesarean section were discussed with the patient including but were not limited to: bleeding which may require transfusion or reoperation; infection which may require antibiotics; injury to bowel, bladder, ureters or other surrounding organs; injury to the fetus; need for additional procedures including hysterectomy in the event of a life-threatening hemorrhage; placental abnormalities wth subsequent pregnancies, incisional problems, thromboembolic phenomenon and other postoperative/anesthesia complications.   The patient concurred with the proposed plan, giving informed written consent for the procedure.    FINDINGS:  Viable female infant in cephalic presentation.  Apgars 9 and 9.  Clear amniotic fluid.  Intact placenta, three vessel cord.  Normal uterus, fallopian tubes and ovaries bilaterally.  ANESTHESIA: Epidural INTRAVENOUS FLUIDS: 1200 ml ESTIMATED BLOOD LOSS: 800 ml URINE OUTPUT:  100 ml SPECIMENS: Placenta sent to L&D COMPLICATIONS: None immediate  PROCEDURE IN DETAIL:  The patient preoperatively received intravenous antibiotics and had sequential compression devices applied to her lower extremities.  She was then taken to the operating room where the epidural anesthesia was dosed up to surgical level and was found to be adequate. She was then placed in a dorsal supine position with a leftward tilt, and prepped and draped in a sterile manner.  A foley catheter was already in place  in her bladder and attached to constant gravity.  After an adequate timeout was performed, a Pfannenstiel skin incision was made with scalpel and carried through to the underlying layer of fascia. The fascia was incised in the midline, and this incision was extended bilaterally using the Mayo scissors.  Kocher clamps were applied to the superior aspect of the fascial incision and the underlying rectus muscles were dissected off sharply.  The rectus muscles were separated in the midline bluntly and the peritoneum was entered bluntly. Attention was turned to the lower uterine segment where a low transverse hysterotomy was made with a scalpel and extended bilaterally bluntly.  The infant was successfully delivered, the cord was clamped and cut and the infant was handed over to awaiting neonatology team. Uterine massage was then administered, and the placenta manually delivered intact with a three-vessel cord. The uterus was then cleared of clot and debris.  The uterus was externalized, the hysterotomy was closed with 0 monocryl in a running locked fashion, and an imbricating layer was also placed with 0 monocryl. The pelvis was cleared of all clot and debris. Hemostasis was confirmed on all surfaces.  The peritoneum and the muscles were reapproximated using 0 Vicryl interrupted stitches. The fascia was then closed using 0 Vicryl in a running fashion.  The subcutaneous layer was irrigated, and 40 ml of Experil was injected subcutaneously around the incision.  The skin was closed with a 4-0 Vicryl subcuticular stitch. The patient tolerated the procedure well. Sponge, lap, instrument and needle counts were correct x 2.  She was taken to the recovery room in stable condition.   Tawana ScaleMichael Ryan Trysten Berti, MD OB Fellow

## 2013-07-06 NOTE — Progress Notes (Signed)
Ur chart review completed.  

## 2013-07-06 NOTE — Progress Notes (Signed)
Shirley Tran is a 23 y.o. Z6X0960G6P2123 at 2881w1d by ultrasound admitted for induction of labor due to Hypertension.  Subjective: Pt on side. Pain is tolerable and she is feeling some UC. Pt is comfortable after amnio infusion.  Objective: BP 111/76  Pulse 109  Temp(Src) 99.5 F (37.5 C) (Oral)  Resp 16  Ht 5\' 3"  (1.6 m)  Wt 101.606 kg (224 lb)  BMI 39.69 kg/m2  SpO2 100%  LMP 10/29/2012      FHT:  FHR: 150s bpm, variability: moderate,  accelerations:  Abscent,  decelerations:  Present late decels frequent but not every ctx, amnio infusion running. will trial O2 UC:   irregular, every 2-4 minutes SVE:   Dilation: 7 Effacement (%): 60 Station: 0 Exam by:: Auriel RN   Labs: Lab Results  Component Value Date   WBC 10.0 07/05/2013   HGB 10.6* 07/05/2013   HCT 32.4* 07/05/2013   MCV 94.2 07/05/2013   PLT 289 07/05/2013    Assessment / Plan: Induction of labor due to gestational hypertension,  progressing well on pitocin  Labor: minimal change over last several hours, IUPC placed, attempting to achieve adequate MVUs but challenging with fetal status and decelerations. Pit up to 4 at this time. Pt has been without change since Preeclampsia:  no signs or symptoms of toxicity Fetal Wellbeing:  Category II Pain Control:  Epidural and Fentanyl I/D:  n/a Anticipated MOD:  NSVD vs PLTCS  Duane BostonDuke, Angela N 07/06/2013, 2:12 AM  I spoke with and examined patient and agree with PA-S's note and plan of care.  Tawana ScaleMichael Ryan Georgianne Gritz, MD Ob Fellow 07/06/2013 8:04 AM

## 2013-07-06 NOTE — Anesthesia Postprocedure Evaluation (Signed)
  Anesthesia Post-op Note  Patient: Shirley Tran  Procedure(s) Performed: Procedure(s) (LRB): Primary Cesarean Section Delivery Baby [phone]@ 0352, Apgars 9/9 (N/A)  Patient Location: PACU  Anesthesia Type: Epidural  Level of Consciousness: awake and alert   Airway and Oxygen Therapy: Patient Spontanous Breathing  Post-op Pain: mild  Post-op Assessment: Post-op Vital signs reviewed, Patient's Cardiovascular Status Stable, Respiratory Function Stable, Patent Airway and No signs of Nausea or vomiting  Last Vitals:  Filed Vitals:   07/06/13 0515  BP: 134/56  Pulse: 97  Temp:   Resp: 16    Post-op Vital Signs: stable   Complications: No apparent anesthesia complications

## 2013-07-06 NOTE — Anesthesia Postprocedure Evaluation (Signed)
  Anesthesia Post-op Note  Patient: Shirley Tran  Procedure(s) Performed: Procedure(s): Primary Cesarean Section Delivery Baby Boy@ [phone], Apgars 9/9 (N/A)  Patient Location: Mother/Baby  Anesthesia Type:Epidural  Level of Consciousness: awake  Airway and Oxygen Therapy: Patient Spontanous Breathing  Post-op Pain: none    Post-op Vital Signs: Reviewed and stable  Complications: No apparent anesthesia complications

## 2013-07-06 NOTE — Clinical Social Work Maternal (Signed)
Clinical Social Work Department PSYCHOSOCIAL ASSESSMENT - MATERNAL/CHILD 07/06/2013  Patient:  Shirley Tran, Shirley Tran  Account Number:  [id number]  Admit Date:  07/05/2013  Ardine Eng Name:   Cyndia Skeeters    Clinical Social Worker:  Gerri Spore, LCSW   Date/Time:  07/06/2013 03:00 PM  Date Referred:  07/06/2013   Referral source  CN     Referred reason  Depression/Anxiety  Substance Abuse   Other referral source:    I:  FAMILY / [place] legal guardian:  PARENT  Guardian - Name Guardian - Age Guardian - Address  Noriah Osgood [address] [place], [place] [address]  Not Disclosed     Other household support members/support persons Name Relationship DOB  Karle Plumber MOTHER    BROTHER 95 years old   23 39 years old   Other support:    II  PSYCHOSOCIAL DATA Information Source:  Patient Interview  Occupational hygienist Employment:   Museum/gallery curator resources:  Self Pay If [place] / Grade:   Maternity Care Coordinator / Child Services Coordination / Early Interventions:  Cultural issues impacting care:    III  STRENGTHS Strengths  Adequate Resources  Home prepared for Child (including basic supplies)  Supportive family/friends   Strength comment:    IV  RISK FACTORS AND CURRENT PROBLEMS Current Problem:  YES   Risk Factor & Current Problem Patient Issue Family Issue Risk Factor / Current Problem Comment  Mental Illness Y N Hx of depression  Substance Abuse Y N Hx of MJ & cocaine  DSS Involvement Y N Loss custody of 3 children    V  SOCIAL WORK ASSESSMENT CSW met with pt to assess her history of substance use, depression & inquire about the reason(s) she does not have custody of her children.  CSW admitted to smoking MJ "everyday" prior to pregnancy confirmation at 4 months. She denies any cocaine use in 2 years.  CSW explained hospital drug testing policy & pt verbalized an  understanding.  UDS is negative, meconium results are pending.  When this writer inquired about the whereabouts of pts children, pt seemed defensive & refused to continue conversation.  Pt told CSW that [place] was involved & her children were place in foster care.  Pt loss custody 2 years ago & thinks that her children may be adopted soon.  Pt admits that she failed to comply with services recommended by the department, which prevented reunification with the children.  As CSW attempted to learned more information, pt said "I'm not answering any more questions because yall just trying to take my baby."  This Probation officer explained the differences between this [place] workers.  CSW express concerns about the pt parenting this baby since she was not able regain custody of her other 3 children.  As a result, CSW reported case to [facility] CPS.  The case was assigned to Sela Hilding who plans to come meet with the pt this evening.  Pt appears upset about CPS involvement & fearful that she will not be allowed to parent this child.  Pt has all the necessary supplies for the infant & appears to be bonding well.  CSW was not able to assess pts history of depression since she was not willing to continue conversation.  CSW will continue to follow & facilitate safe discharge.      VI SOCIAL WORK  PLAN Social Work Systems analyst Report  Psychosocial Support/Ongoing Assessment of Needs   Type of pt/family education:   If child protective services report - county:  [place] If child protective services report - date:  07/06/2013 Information/referral to community resources comment:   Other social work plan:

## 2013-07-07 ENCOUNTER — Encounter (HOSPITAL_COMMUNITY): Payer: Self-pay | Admitting: Obstetrics & Gynecology

## 2013-07-07 LAB — BIRTH TISSUE RECOVERY COLLECTION (PLACENTA DONATION)

## 2013-07-07 LAB — CBC
HCT: 25.1 % — ABNORMAL LOW (ref 36.0–46.0)
Hemoglobin: 8.3 g/dL — ABNORMAL LOW (ref 12.0–15.0)
MCH: 31 pg (ref 26.0–34.0)
MCHC: 33.1 g/dL (ref 30.0–36.0)
MCV: 93.7 fL (ref 78.0–100.0)
Platelets: 236 10*3/uL (ref 150–400)
RBC: 2.68 MIL/uL — ABNORMAL LOW (ref 3.87–5.11)
RDW: 14.6 % (ref 11.5–15.5)
WBC: 10.6 10*3/uL — ABNORMAL HIGH (ref 4.0–10.5)

## 2013-07-07 NOTE — Progress Notes (Signed)
Post Partum Day 1 Subjective: up ad lib, voiding, tolerating PO and + flatus.  Pt denies dizziness.  Pain controlled with pain medication.  No questions or concerns.    Ms. Shirley Tran is a 23 y.o. [id number] POD#1 following PLTCS for failure to progress and arrest of dilation. She originally presented for IOL for CHTN.  She c/o of a painful burning sensation when she stands, but she has been ambulating.  Objective: Blood pressure 109/72, pulse 90, temperature 99 F (37.2 C), temperature source Oral, resp. rate 18, height 5\' 3"  (1.6 m), weight 101.606 kg (224 lb), last menstrual period 10/29/2012, SpO2 100.00%, unknown if currently breastfeeding.      Physical Exam:  General: alert, cooperative and appears stated age CVS:  RRR, without murmur, gallops, or rubs Lungs:  CTA bilat ABD:  +BSx4, normal Lochia: appropriate Uterine Fundus: firm Incision: no significant drainage, dressing clean, dry, and intact DVT Evaluation: No evidence of DVT seen on physical exam. Negative Homan's sign.   Recent Labs  07/05/13 0800 07/07/13 0605  HGB 10.6* 8.3*  HCT 32.4* 25.1*    Assessment/Plan: Plan for discharge tomorrow and Breastfeeding  Pt requests MOC Micronor until she can get paragard.    LOS: 2 days   [facility], Angela N 07/07/2013, 7:23 AM   I examined pt and agree with documentation above and PA-S plan of care. [facility] LL[facility]UHAMMAD,WALIDAH

## 2013-07-07 NOTE — Progress Notes (Signed)
Shirley Tran, [facility] CPS worker met with pt yesterday evening & completed a safety assessment.  Copy placed in infants paper chart.  Infant approved to discharge home with MOB when medically stable.  No barriers to discharge at this time.

## 2013-07-08 MED ORDER — NORETHINDRONE 0.35 MG PO TABS: 1.0000 | ORAL_TABLET | Freq: Every day | ORAL | Status: DC

## 2013-07-08 MED ORDER — SIMETHICONE 80 MG PO CHEW: 80.0000 mg | CHEWABLE_TABLET | Freq: Three times a day (TID) | ORAL | Status: DC

## 2013-07-08 MED ORDER — OXYCODONE-ACETAMINOPHEN 5-325 MG PO TABS: 1.0000 | ORAL_TABLET | ORAL | Status: DC | PRN

## 2013-07-08 MED ORDER — IBUPROFEN 600 MG PO TABS: 600.0000 mg | ORAL_TABLET | Freq: Four times a day (QID) | ORAL | Status: DC

## 2013-07-08 NOTE — Discharge Summary (Signed)
Obstetric Discharge Summary Reason for Admission: induction of labor Prenatal Procedures: none Intrapartum Procedures: cesarean: low cervical, transverse Postpartum Procedures: none Complications-Operative and Postpartum: none Hemoglobin  Date Value Ref Range Status  07/07/2013 8.3* 12.0 - 15.0 g/dL Final     DELTA CHECK NOTED     REPEATED TO VERIFY     HCT  Date Value Ref Range Status  07/07/2013 25.1* 36.0 - 46.0 % Final   Hospital Course: Ms. Delford FieldWright is a 23 y.o. [id number] POD#2 following PLTCS for failure to progress and arrest of dilation. She originally presented for IOL for CHTN. Her delivery and postpartum course were uncomplicated. Social work was consulted because she lost her 3 older children due to drug use and depression. She [facility]. She was discharged on POD#2 with her child.  Physical Exam:  BP 113/76  Pulse 91  Temp(Src) 97.9 F (36.6 C) (Oral)  Resp 18  Ht 5\' 3"  (1.6 m)  Wt 101.606 kg (224 lb)  BMI 39.69 kg/m2  SpO2 100%  LMP 10/29/2012  General: alert, cooperative and no distress Lochia: appropriate Uterine Fundus: firm Incision: no significant drainage, no significant erythema DVT Evaluation: No evidence of DVT seen on physical exam. No cords or calf tenderness. Trace, symmetric LE edema  Discharge Diagnoses: Term Pregnancy-delivered and Failed induction  Discharge Information: Date: 07/08/2013 Activity: pelvic rest Diet: routine Medications: PNV, Ibuprofen, Percocet and POPs Condition: stable Instructions: refer to practice specific booklet Discharge to: home Contraception: POPs to mirena at 4 weeks (refused depo) Follow-up Information   Follow up with [facility]. Schedule an appointment as soon as possible for a visit in 4 weeks.      Newborn Data: Live born female  Birth Weight: 7 lb 6.9 oz (3370 g) APGAR: 9, 9  Home with mother.  Beverely Lowdamo, Elena 07/08/2013, 7:51  AM  I was present for the exam and agree with above.  Anemia, hemodynamically stable--FeSO4.   [place] K[place] Marling, CNM 07/09/2013 5:20 AM

## 2013-07-08 NOTE — Discharge Instructions (Signed)
Postpartum Care After Cesarean Delivery °After you deliver your newborn (postpartum period), the usual stay in the hospital is 24 72 hours. If there were problems with your labor or delivery, or if you have other medical problems, you might be in the hospital longer.  °While you are in the hospital, you will receive help and instructions on how to care for yourself and your newborn during the postpartum period.  °While you are in the hospital: °· It is normal for you to have pain or discomfort from the incision in your abdomen. Be sure to tell your nurses when you are having pain, where the pain is located, and what makes the pain worse. °· If you are breastfeeding, you may feel uncomfortable contractions of your uterus for a couple of weeks. This is normal. The contractions help your uterus get back to normal size. °· It is normal to have some bleeding after delivery. °· For the first 1 3 days after delivery, the flow is red and the amount may be similar to a period. °· It is common for the flow to start and stop. °· In the first few days, you may pass some small clots. Let your nurses know if you begin to pass large clots or your flow increases. °· Do not  flush blood clots down the toilet before having the nurse look at them. °· During the next 3 10 days after delivery, your flow should become more watery and pink or brown-tinged in color. °· Ten to fourteen days after delivery, your flow should be a small amount of yellowish-white discharge. °· The amount of your flow will decrease over the first few weeks after delivery. Your flow may stop in 6 8 weeks. Most women have had their flow stop by 12 weeks after delivery. °· You should change your sanitary pads frequently. °· Wash your hands thoroughly with soap and water for at least 20 seconds after changing pads, using the toilet, or before holding or feeding your newborn. °· Your intravenous (IV) tubing will be removed when you are drinking enough fluids. °· The  urine drainage tube (urinary catheter) that was inserted before delivery may be removed within 6 8 hours after delivery or when feeling returns to your legs. You should feel like you need to empty your bladder within the first 6 8 hours after the catheter has been removed. °· In case you become weak, lightheaded, or faint, call your nurse before you get out of bed for the first time and before you take a shower for the first time. °· Within the first few days after delivery, your breasts may begin to feel tender and full. This is called engorgement. Breast tenderness usually goes away within 48 72 hours after engorgement occurs. You may also notice milk leaking from your breasts. If you are not breastfeeding, do not stimulate your breasts. Breast stimulation can make your breasts produce more milk. °· Spending as much time as possible with your newborn is very important. During this time, you and your newborn can feel close and get to know each other. Having your newborn stay in your room (rooming in) will help to strengthen the bond with your newborn. It will give you time to get to know your newborn and become comfortable caring for your newborn. °· Your hormones change after delivery. Sometimes the hormone changes can temporarily cause you to feel sad or tearful. These feelings should not last more than a few days. If these feelings last longer   than that, you should talk to your caregiver. °· If desired, talk to your caregiver about methods of family planning or contraception. °· Talk to your caregiver about immunizations. Your caregiver may want you to have the following immunizations before leaving the hospital: °· Tetanus, diphtheria, and pertussis (Tdap) or tetanus and diphtheria (Td) immunization. It is very important that you and your family (including grandparents) or others caring for your newborn are up-to-date with the Tdap or Td immunizations. The Tdap or Td immunization can help protect your newborn  from getting ill. °· Rubella immunization. °· Varicella (chickenpox) immunization. °· Influenza immunization. You should receive this annual immunization if you did not receive the immunization during your pregnancy. °Document Released: 02/06/2012 Document Reviewed: 02/06/2012 °ExitCare® Patient Information ©2014 [employer], LLC. ° °

## 2013-07-09 NOTE — Discharge Summary (Signed)
Attestation of Attending Supervision of Advanced Practitioner (CNM/NP): Evaluation and management procedures were performed by the Advanced Practitioner under my supervision and collaboration.  I have reviewed the Advanced Practitioner's note and chart, and I agree with the management and plan.  HARRAWAY-SMITH, Tom Ragsdale 6:41 AM     

## 2013-08-12 ENCOUNTER — Ambulatory Visit: Payer: Medicaid Other | Admitting: Obstetrics & Gynecology

## 2013-08-17 NOTE — Progress Notes (Signed)
NST reveiwed and reactive  

## 2014-01-22 ENCOUNTER — Encounter: Payer: Self-pay | Admitting: General Practice

## 2014-03-22 ENCOUNTER — Inpatient Hospital Stay (HOSPITAL_COMMUNITY)
Admission: AD | Admit: 2014-03-22 | Discharge: 2014-03-22 | Discharge status: Against Medical Advice | Payer: Medicaid Other | Source: Ambulatory Visit | Attending: Obstetrics and Gynecology | Admitting: Obstetrics and Gynecology

## 2014-03-22 NOTE — MAU Note (Signed)
Not in lobby

## 2014-03-22 NOTE — MAU Note (Signed)
Pt not in lobby.  

## 2014-03-22 NOTE — MAU Note (Signed)
Pt not in lobby at 1700

## 2014-03-22 NOTE — MAU Note (Signed)
Pt not in lobby, third call

## 2014-03-29 ENCOUNTER — Encounter (HOSPITAL_COMMUNITY): Payer: Self-pay | Admitting: Obstetrics & Gynecology

## 2014-04-20 ENCOUNTER — Encounter (HOSPITAL_COMMUNITY): Payer: Self-pay

## 2014-04-20 ENCOUNTER — Inpatient Hospital Stay (HOSPITAL_COMMUNITY)
Admission: AD | Admit: 2014-04-20 | Discharge: 2014-04-20 | Disposition: A | Discharge status: Home / Self Care | Payer: Medicaid Other | Source: Ambulatory Visit | Attending: Obstetrics and Gynecology | Admitting: Obstetrics and Gynecology

## 2014-04-20 DIAGNOSIS — O26899 Other specified pregnancy related conditions, unspecified trimester: Secondary | ICD-10-CM | POA: Insufficient documentation

## 2014-04-20 DIAGNOSIS — K5900 Constipation, unspecified: Secondary | ICD-10-CM

## 2014-04-20 DIAGNOSIS — O99330 Smoking (tobacco) complicating pregnancy, unspecified trimester: Secondary | ICD-10-CM | POA: Insufficient documentation

## 2014-04-20 DIAGNOSIS — O0930 Supervision of pregnancy with insufficient antenatal care, unspecified trimester: Secondary | ICD-10-CM | POA: Insufficient documentation

## 2014-04-20 DIAGNOSIS — O99320 Drug use complicating pregnancy, unspecified trimester: Secondary | ICD-10-CM | POA: Diagnosis not present

## 2014-04-20 DIAGNOSIS — F1220 Cannabis dependence, uncomplicated: Secondary | ICD-10-CM | POA: Insufficient documentation

## 2014-04-20 DIAGNOSIS — Z3A00 Weeks of gestation of pregnancy not specified: Secondary | ICD-10-CM | POA: Diagnosis not present

## 2014-04-20 DIAGNOSIS — R58 Hemorrhage, not elsewhere classified: Secondary | ICD-10-CM | POA: Insufficient documentation

## 2014-04-20 DIAGNOSIS — O26892 Other specified pregnancy related conditions, second trimester: Secondary | ICD-10-CM

## 2014-04-20 LAB — OB RESULTS CONSOLE GC/CHLAMYDIA
Chlamydia: NEGATIVE
GC PROBE AMP, GENITAL: NEGATIVE

## 2014-04-20 LAB — URINALYSIS, ROUTINE W REFLEX MICROSCOPIC
GLUCOSE, UA: NEGATIVE mg/dL
HGB URINE DIPSTICK: NEGATIVE
Leukocytes, UA: NEGATIVE
Nitrite: NEGATIVE
PROTEIN: NEGATIVE mg/dL
Specific Gravity, Urine: 1.025 (ref 1.005–1.030)
Urobilinogen, UA: 2 mg/dL — ABNORMAL HIGH (ref 0.0–1.0)
pH: 6.5 (ref 5.0–8.0)

## 2014-04-20 LAB — WET PREP, GENITAL
Trich, Wet Prep: NONE SEEN
YEAST WET PREP: NONE SEEN

## 2014-04-20 MED ORDER — POLYETHYLENE GLYCOL 3350 17 G PO PACK: 17.0000 g | PACK | Freq: Every day | ORAL | Status: DC

## 2014-04-20 NOTE — MAU Note (Addendum)
Thinks she is 6 months along now, no prenatal care.  Just delivered in Feb 2015 by C/S.  Having vaginal bleeding since last night when wiped, spotting.  But today having bleeding like period and having cramps.  History of high blood pressure and is afraid her blood pressure is elevated.  No leaking.  Reports some baby movement but feels it is less today.  Having white discharge and sometimes burning with urination.

## 2014-04-20 NOTE — Discharge Instructions (Signed)
Prenatal Care  WHAT IS PRENATAL CARE?  Prenatal care means health care during your pregnancy, before your baby is born. It is very important to take care of yourself and your baby during your pregnancy by:   Getting early prenatal care. If you know you are pregnant, or think you might be pregnant, call your health care provider as soon as possible. Schedule a visit for a prenatal exam.  Getting regular prenatal care. Follow your health care provider's schedule for blood and other necessary tests. Do not miss appointments.  Doing everything you can to keep yourself and your baby healthy during your pregnancy.  Getting complete care. Prenatal care should include evaluation of the medical, dietary, educational, psychological, and social needs of you and your significant other. The medical and genetic history of your family and the family of your baby's father should be discussed with your health care provider.  Discussing with your health care provider:  Prescription, over-the-counter, and herbal medicines that you take.  Any history of substance abuse, alcohol use, smoking, and illegal drug use.  Any history of domestic abuse and violence.  Immunizations you have received.  Your nutrition and diet.  The amount of exercise you do.  Any environmental and occupational hazards to which you are exposed.  History of sexually transmitted infections for both you and your partner.  Previous pregnancies you have had. WHY IS PRENATAL CARE SO IMPORTANT?  By regularly seeing your health care provider, you help ensure that problems can be identified early so that they can be treated as soon as possible. Other problems might be prevented. Many studies have shown that early and regular prenatal care is important for the health of mothers and their babies.  HOW CAN I TAKE CARE OF MYSELF WHILE I AM PREGNANT?  Here are ways to take care of yourself and your baby:   Start or continue taking your  multivitamin with 400 micrograms (mcg) of folic acid every day.  Get early and regular prenatal care. It is very important to see a health care provider during your pregnancy. Your health care provider will check at each visit to make sure that you and your baby are healthy. If there are any problems, action can be taken right away to help you and your baby.  Eat a healthy diet that includes:  Fruits.  Vegetables.  Foods low in saturated fat.  Whole grains.  Calcium-rich foods, such as milk, yogurt, and hard cheeses.  Drink 6-8 glasses of liquids a day.  Unless your health care provider tells you not to, try to be physically active for 30 minutes, most days of the week. If you are pressed for time, you can get your activity in through 10-minute segments, three times a day.  Do not smoke, drink alcohol, or use drugs. These can cause long-term damage to your baby. Talk with your health care provider about steps to take to stop smoking. Talk with a member of your faith community, a counselor, a trusted friend, or your health care provider if you are concerned about your alcohol or drug use.  Ask your health care provider before taking any medicine, even over-the-counter medicines. Some medicines are not safe to take during pregnancy.  Get plenty of rest and sleep.  Avoid hot tubs and saunas during pregnancy.  Do not have X-rays taken unless absolutely necessary and with the recommendation of your health care provider. A lead shield can be placed on your abdomen to protect your baby  when X-rays are taken in other parts of your body.  Do not empty the cat litter when you are pregnant. It may contain a parasite that causes an infection called toxoplasmosis, which can cause birth defects. Also, use gloves when working in garden areas used by cats.  Do not eat uncooked or undercooked meats or fish.  Do not eat soft, mold-ripened cheeses (Brie, Camembert, and chevre) or soft, blue-veined  cheese (Danish blue and Roquefort).  Stay away from toxic chemicals like:  Insecticides.  Solvents (some cleaners or paint thinners).  Lead.  Mercury.  Sexual intercourse may continue until the end of the pregnancy, unless you have a medical problem or there is a problem with the pregnancy and your health care provider tells you not to.  Do not wear high-heel shoes, especially during the second half of the pregnancy. You can lose your balance and fall.  Do not take long trips, unless absolutely necessary. Be sure to see your health care provider before going on the trip.  Do not sit in one position for more than 2 hours when on a trip.  Take a copy of your medical records when going on a trip. Know where a hospital is located in the city you are visiting, in case of an emergency.  Most dangerous household products will have pregnancy warnings on their labels. Ask your health care provider about products if you are unsure.  Limit or eliminate your caffeine intake from coffee, tea, sodas, medicines, and chocolate.  Many women continue working through pregnancy. Staying active might help you stay healthier. If you have a question about the safety or the hours you work at your particular job, talk with your health care provider.  Get informed:  Read books.  Watch videos.  Go to childbirth classes for you and your significant other.  Talk with experienced moms.  Ask your health care provider about childbirth education classes for you and your partner. Classes can help you and your partner prepare for the birth of your baby.  Ask about a baby doctor (pediatrician) and methods and pain medicine for labor, delivery, and possible cesarean delivery. HOW OFTEN SHOULD I SEE MY HEALTH CARE PROVIDER DURING PREGNANCY?  Your health care provider will give you a schedule for your prenatal visits. You will have visits more often as you get closer to the end of your pregnancy. An average  pregnancy lasts about 40 weeks.  A typical schedule includes visiting your health care provider:   About once each month during your first 6 months of pregnancy.  Every 2 weeks during the next 2 months.  Weekly in the last month, until the delivery date. Your health care provider will probably want to see you more often if:  You are older than 35 years.  Your pregnancy is high risk because you have certain health problems or problems with the pregnancy, such as:  Diabetes.  High blood pressure.  The baby is not growing on schedule, according to the dates of the pregnancy. Your health care provider will do special tests to make sure you and your baby are not having any serious problems. WHAT HAPPENS DURING PRENATAL VISITS?   At your first prenatal visit, your health care provider will do a physical exam and talk to you about your health history and the health history of your partner and your family. Your health care provider will be able to tell you what date to expect your baby to be born on.  Your first physical exam will include checks of your blood pressure, measurements of your height and weight, and an exam of your pelvic organs. Your health care provider will do a Pap test if you have not had one recently and will do cultures of your cervix to make sure there is no infection.  At each prenatal visit, there will be tests of your blood, urine, blood pressure, weight, and the progress of the baby will be checked.  At your later prenatal visits, your health care provider will check how you are doing and how your baby is developing. You may have a number of tests done as your pregnancy progresses.  Ultrasound exams are often used to check on your baby's growth and health.  You may have more urine and blood tests, as well as special tests, if needed. These may include amniocentesis to examine fluid in the pregnancy sac, stress tests to check how the baby responds to contractions, or a  biophysical profile to measure your baby's well-being. Your health care provider will explain the tests and why they are necessary.  You should be tested for high blood sugar (gestational diabetes) between the 24th and 28th weeks of your pregnancy.  You should discuss with your health care provider your plans to breastfeed or bottle-feed your baby.  Each visit is also a chance for you to learn about staying healthy during pregnancy and to ask questions. Document Released: 05/17/2003 Document Revised: 05/19/2013 Document Reviewed: 07/29/2013 [facility] Patient Information 2015 [place]. This information is not intended to replace advice given to you by your health care provider. Make sure you discuss any questions you have with your health care provider.  Constipation Constipation is when a person:  Poops (has a bowel movement) less than 3 times a week.  Has a hard time pooping.  Has poop that is dry, hard, or bigger than normal. HOME CARE   Eat foods with a lot of fiber in them. This includes fruits, vegetables, beans, and whole grains such as brown rice.  Avoid fatty foods and foods with a lot of sugar. This includes french fries, hamburgers, cookies, candy, and soda.  If you are not getting enough fiber from food, take products with added fiber in them (supplements).  Drink enough fluid to keep your pee (urine) clear or pale yellow.  Exercise on a regular basis, or as told by your doctor.  Go to the restroom when you feel like you need to poop. Do not hold it.  Only take medicine as told by your doctor. Do not take medicines that help you poop (laxatives) without talking to your doctor first. GET HELP RIGHT AWAY IF:   You have bright red blood in your poop (stool).  Your constipation lasts more than 4 days or gets worse.  You have belly (abdominal) or butt (rectal) pain.  You have thin poop (as thin as a pencil).  You lose weight, and it cannot be explained. MAKE  SURE YOU:   Understand these instructions.  Will watch your condition.  Will get help right away if you are not doing well or get worse. Document Released: 10/31/2007 Document Revised: 05/19/2013 Document Reviewed: 02/23/2013 [facility] Patient Information 2015 [place], [place]. This information is not intended to replace advice given to you by your health care provider. Make sure you discuss any questions you have with your health care provider.

## 2014-04-20 NOTE — MAU Note (Signed)
Pt states she was concerned about elevated. Pt states she was also having vaginal bleeding and cramping that started yesterday and today cramping woke pt up and when she went to wipe there was a lot of blood.

## 2014-04-20 NOTE — MAU Provider Note (Signed)
History     CSN: [id number]  Arrival date and time: 04/20/14 1911   None     Chief Complaint  Patient presents with  . Vaginal Bleeding   HPI Shirley Tran is a 23 y.o. [id number] at unknown gestational age that presents with vaginal bleeding and cramping that started today. Bleeding was not flowing but states she noticed it in the toilet and when she wiped. Unsure if blood was coming from vagina or rectum. She states that her bowel movement was difficult and that she was not able to see the stool in the bowl. She has associated cramping in her inguinal area and dysuria. No fevers. She had one episode of emesis this morning. She reports white vaginal discharge, no LOF, +fetal movement. Has had a history of UTI in the past. Currently smokes about 2-3 cigarettes per day. Uses marijuana, last used yesterday.   OB History    Gravida Para Term Preterm AB TAB SAB Ectopic Multiple Living   7 4 3 1 2 1 1  0 0 4      Past Medical History  Diagnosis Date  . Chlamydia 2006    treated  . Candidiasis 2010    treatment  . Cystitis   . Reflux   . Seizures 2012    only had 1 seizure  . Hypertension     out of meds, no PCP  . Complication of anesthesia     back pain  . Postpartum depression     states depression all the time, worse postpartum    Past Surgical History  Procedure Laterality Date  . Dilation and curettage of uterus    . Cesarean section N/A 07/06/2013    Procedure: Primary Cesarean Section Delivery Baby Boy@ 0352, Apgars 9/9;  Surgeon: Lazaro ArmsLuther H Eure, MD;  Location: [facility];  Service: Obstetrics;  Laterality: N/A;    Family History  Problem Relation Age of Onset  . Heart disease Maternal Grandmother   . Thrombophlebitis Maternal Grandmother   . Diabetes Maternal Grandmother   . Anemia Mother   . Sickle cell trait Daughter     History  Substance Use Topics  . Smoking status: Current Every Day Smoker -- 0.25 packs/day for 7 years    Types: Cigarettes  . Smokeless  tobacco: Never Used     Comment: 12/25/12 smokes Black mild-about 1 per day  . Alcohol Use: No    Allergies:  Allergies  Allergen Reactions  . Elidel [Pimecrolimus] Itching, Swelling and Rash  . Nickel Rash    Facility-administered medications prior to admission  Medication Dose Route Frequency Provider Last Rate Last Dose  . Tdap (BOOSTRIX) injection 0.5 mL  0.5 mL Intramuscular Once Reva Boresanya S Pratt, MD       Prescriptions prior to admission  Medication Sig Dispense Refill Last Dose  . acetaminophen (TYLENOL) 500 MG tablet Take 1,000 mg by mouth every 6 (six) hours as needed for moderate pain or headache.   04/19/2014 at 2200  . ibuprofen (ADVIL,MOTRIN) 600 MG tablet Take 1 tablet (600 mg total) by mouth every 6 (six) hours. (Patient not taking: Reported on 04/20/2014) 30 tablet 0 Not Taking at Unknown time  . norethindrone (MICRONOR,CAMILA,ERRIN) 0.35 MG tablet Take 1 tablet (0.35 mg total) by mouth daily. Take at the exact same time every day. (Patient not taking: Reported on 04/20/2014) 1 Package 11 Not Taking at Unknown time  . oxyCODONE-acetaminophen (PERCOCET/ROXICET) 5-325 MG per tablet Take 1-2 tablets by mouth every 4 (four) hours  as needed for severe pain (moderate - severe pain). (Patient not taking: Reported on 04/20/2014) 30 tablet 0 Not Taking  . Prenatal Vit-Min-FA-Fish Oil ([employer] PRENATAL GUMMY PO) Take 1 each by mouth 2 (two) times daily.   Completed Course at Unknown time  . simethicone (MYLICON) 80 MG chewable tablet Chew 1 tablet (80 mg total) by mouth 4 (four) times daily - after meals and at bedtime. (Patient not taking: Reported on 04/20/2014) 30 tablet 0 Not Taking at Unknown time    Review of Systems  Constitutional: Negative for fever.  Respiratory: Negative for cough and shortness of breath.   Cardiovascular: Negative for chest pain.  Gastrointestinal: Positive for constipation.  Genitourinary: Positive for dysuria. Negative for hematuria.   Physical Exam    Blood pressure 132/74, pulse 95, temperature 98.8 F (37.1 C), temperature source Oral, resp. rate 18, height 5\' 3"  (1.6 m), weight 91.683 kg (202 lb 2 oz), last menstrual period 01/07/2014, SpO2 100 %, unknown if currently breastfeeding.  Physical Exam  Constitutional: She is oriented to person, place, and time. She appears well-developed and well-nourished.  Cardiovascular: Normal rate, regular rhythm and normal heart sounds.   Respiratory: Effort normal and breath sounds normal. She has no wheezes.  GI: Soft. There is no tenderness.  Genitourinary: Rectum normal. Rectal exam shows no external hemorrhoid, no internal hemorrhoid, no fissure and anal tone normal. Cervix exhibits no discharge and no friability. No bleeding in the vagina. Vaginal discharge (white, thick) found.  Musculoskeletal: Normal range of motion. She exhibits edema. She exhibits no tenderness.  Neurological: She is alert and oriented to person, place, and time.    MAU Course  Procedures  MDM  Urinalysis    Component Value Date/Time   COLORURINE YELLOW 04/20/2014 2100   APPEARANCEUR CLEAR 04/20/2014 2100   LABSPEC 1.025 04/20/2014 2100   PHURINE 6.5 04/20/2014 2100   GLUCOSEU NEGATIVE 04/20/2014 2100   HGBUR NEGATIVE 04/20/2014 2100   BILIRUBINUR SMALL* 04/20/2014 2100   KETONESUR >80* 04/20/2014 2100   PROTEINUR NEGATIVE 04/20/2014 2100   UROBILINOGEN 2.0* 04/20/2014 2100   NITRITE NEGATIVE 04/20/2014 2100   LEUKOCYTESUR NEGATIVE 04/20/2014 2100   Wet Prep     9:42 PM    Yeast Wet Prep HPF POC NONE SEEN   Trich, Wet Prep NONE SEEN   Clue Cells Wet Prep HPF POC FEW (A)   WBC, Wet Prep HPF POC FEW (A)   Drugs of Abuse     Component Value Date/Time   LABOPIA NONE DETECTED 04/20/2014 2100   COCAINSCRNUR NONE DETECTED 04/20/2014 2100   LABBENZ NONE DETECTED 04/20/2014 2100   AMPHETMU NONE DETECTED 04/20/2014 2100   THCU POSITIVE* 04/20/2014 2100   LABBARB NONE DETECTED 04/20/2014 2100      FHT: 150 bpm, 10x10 accelerations, occasional variables, appropriate for assumed gestational age Contractions: None  Assessment and Plan  Shirley Tran is a 23 y.o. [id number] with unknown GA (approximately 24w).  #Bleeding: may be related to difficult bowel movement. No hemorrhoids felt or seen on exam, however, could still be the cause. FOBT could not be performed. Wet prep negative. Urinalysis not suggestive of UTI. - precautions discussed  #Constipation - Miralax daily  #No prenatal care - Schedule outpatient ultrasound - [facility]  Jacquelin Hawkingettey, Ralph 04/20/2014, 9:00 PM   I have seen and examined this patient and I agree with the above. Cam HaiSHAW, KIMBERLY CNM 9:16 AM 04/21/2014

## 2014-04-21 ENCOUNTER — Telehealth: Payer: Self-pay | Admitting: *Deleted

## 2014-04-21 LAB — RAPID URINE DRUG SCREEN, HOSP PERFORMED
Amphetamines: NOT DETECTED
BARBITURATES: NOT DETECTED
BENZODIAZEPINES: NOT DETECTED
COCAINE: NOT DETECTED
OPIATES: NOT DETECTED
TETRAHYDROCANNABINOL: POSITIVE — AB

## 2014-04-21 NOTE — Telephone Encounter (Addendum)
Pt left message stating that she was seen @ MAU last pm.  She was instructed to call and get her US scheduled.  Please call back.  I returned pt's call and left message to call us back and state whether a detailed message can be left on her voice mail.  US has been scheduled for 12/2 @ 1330.  11/30  1220  Called pt @ home and mobile #'s.  Messages left requesting pt to call back regarding her call from last week and to state whether a detailed message may be left on the voice mail.  12/1   0935  Called pt @ home and mobile #'s.  Messages left again to call us back regarding her request from phone call last week.

## 2014-04-23 LAB — GC/CHLAMYDIA PROBE AMP
CT Probe RNA: NEGATIVE
GC Probe RNA: NEGATIVE

## 2014-04-28 ENCOUNTER — Other Ambulatory Visit (HOSPITAL_COMMUNITY): Payer: Self-pay | Admitting: Advanced Practice Midwife

## 2014-04-28 ENCOUNTER — Ambulatory Visit (HOSPITAL_COMMUNITY)
Admission: RE | Admit: 2014-04-28 | Discharge: 2014-04-28 | Disposition: A | Discharge status: Home / Self Care | Payer: Medicaid Other | Source: Ambulatory Visit | Attending: Advanced Practice Midwife | Admitting: Advanced Practice Midwife

## 2014-04-28 DIAGNOSIS — O0930 Supervision of pregnancy with insufficient antenatal care, unspecified trimester: Secondary | ICD-10-CM

## 2014-04-28 NOTE — Telephone Encounter (Signed)
Attempted to contact patient, no answer, left message with ultrasound appointment date/time and if she needs anything further to call the clinic.  Attempted to contact the patient's mother, no answer, number invalid.

## 2014-04-29 ENCOUNTER — Encounter: Payer: Self-pay | Admitting: Obstetrics & Gynecology

## 2014-04-29 ENCOUNTER — Encounter: Payer: Self-pay | Admitting: Obstetrics and Gynecology

## 2014-04-29 ENCOUNTER — Encounter: Payer: Medicaid Other | Admitting: Obstetrics and Gynecology

## 2014-04-29 DIAGNOSIS — F1210 Cannabis abuse, uncomplicated: Secondary | ICD-10-CM | POA: Insufficient documentation

## 2014-04-29 DIAGNOSIS — O10919 Unspecified pre-existing hypertension complicating pregnancy, unspecified trimester: Secondary | ICD-10-CM | POA: Insufficient documentation

## 2014-04-29 DIAGNOSIS — O0930 Supervision of pregnancy with insufficient antenatal care, unspecified trimester: Secondary | ICD-10-CM | POA: Insufficient documentation

## 2014-05-13 ENCOUNTER — Encounter: Payer: Self-pay | Admitting: Obstetrics & Gynecology

## 2014-05-13 ENCOUNTER — Encounter: Payer: Medicaid Other | Admitting: Obstetrics & Gynecology

## 2014-05-28 NOTE — L&D Delivery Note (Signed)
Delivery Note At 10:20 AM a viable female was delivered via VBAC, Spontaneous (Presentation: Right Occiput Anterior).  APGAR: 8, 9; weight 6 lb 11.6 oz (3050 g).   Placenta status: Intact, Spontaneous.  Cord: 3 vessels with the following complications: None.  Patient is a [id number] at 2962w6d. Labor progressed w/o complication. Delivery occurred w/ some tightness at the shoulders. Shoulders eventually delivered during a period of non-contraction from mom, and no suprapubic pressure was required.   Anesthesia: Epidural  Episiotomy: None Lacerations: None Suture Repair: n/a Est. Blood Loss (mL): 100  Mom to postpartum.  Baby to Couplet care / Skin to Skin.  Kathee DeltonMcKeag, Armistead Sult D 07/08/2014, 4:23 PM

## 2014-06-08 ENCOUNTER — Encounter: Payer: Self-pay | Admitting: General Practice

## 2014-07-08 ENCOUNTER — Inpatient Hospital Stay (HOSPITAL_COMMUNITY): Payer: Medicaid Other | Admitting: Anesthesiology

## 2014-07-08 ENCOUNTER — Encounter (HOSPITAL_COMMUNITY): Payer: Self-pay | Admitting: *Deleted

## 2014-07-08 ENCOUNTER — Inpatient Hospital Stay (HOSPITAL_COMMUNITY)
Admission: AD | Admit: 2014-07-08 | Discharge: 2014-07-09 | DRG: 775 | Disposition: A | Discharge status: Home / Self Care | Payer: Medicaid Other | Source: Ambulatory Visit | Attending: Obstetrics & Gynecology | Admitting: Obstetrics & Gynecology

## 2014-07-08 DIAGNOSIS — Z3A37 37 weeks gestation of pregnancy: Secondary | ICD-10-CM | POA: Diagnosis present

## 2014-07-08 DIAGNOSIS — IMO0001 Reserved for inherently not codable concepts without codable children: Secondary | ICD-10-CM

## 2014-07-08 DIAGNOSIS — O4290 Premature rupture of membranes, unspecified as to length of time between rupture and onset of labor, unspecified weeks of gestation: Secondary | ICD-10-CM | POA: Diagnosis present

## 2014-07-08 DIAGNOSIS — Z3483 Encounter for supervision of other normal pregnancy, third trimester: Secondary | ICD-10-CM | POA: Diagnosis present

## 2014-07-08 LAB — HEPATITIS B SURFACE ANTIGEN: Hepatitis B Surface Ag: NEGATIVE

## 2014-07-08 LAB — COMPREHENSIVE METABOLIC PANEL
ALT: 9 U/L (ref 0–35)
AST: 15 U/L (ref 0–37)
Albumin: 2.8 g/dL — ABNORMAL LOW (ref 3.5–5.2)
Alkaline Phosphatase: 156 U/L — ABNORMAL HIGH (ref 39–117)
Anion gap: 5 (ref 5–15)
BUN: 9 mg/dL (ref 6–23)
CALCIUM: 8.9 mg/dL (ref 8.4–10.5)
CHLORIDE: 109 mmol/L (ref 96–112)
CO2: 21 mmol/L (ref 19–32)
Creatinine, Ser: 0.62 mg/dL (ref 0.50–1.10)
GFR calc Af Amer: >90 mL/min (ref >90)
GFR calc non Af Amer: >90 mL/min (ref >90)
Glucose, Bld: 79 mg/dL (ref 70–99)
Potassium: 4.4 mmol/L (ref 3.5–5.1)
SODIUM: 135 mmol/L (ref 135–145)
Total Bilirubin: 0.4 mg/dL (ref 0.3–1.2)
Total Protein: 6 g/dL (ref 6.0–8.3)

## 2014-07-08 LAB — PROTEIN / CREATININE RATIO, URINE
Creatinine, Urine: 61 mg/dL
Total Protein, Urine: <6 mg/dL

## 2014-07-08 LAB — OB RESULTS CONSOLE GBS: STREP GROUP B AG: NEGATIVE

## 2014-07-08 LAB — RAPID URINE DRUG SCREEN, HOSP PERFORMED
AMPHETAMINES: NOT DETECTED
BARBITURATES: NOT DETECTED
Benzodiazepines: NOT DETECTED
COCAINE: NOT DETECTED
Opiates: NOT DETECTED
Tetrahydrocannabinol: POSITIVE — AB

## 2014-07-08 LAB — TYPE AND SCREEN
ABO/RH(D): O POS
Antibody Screen: NEGATIVE

## 2014-07-08 LAB — CBC
HCT: 34.6 % — ABNORMAL LOW (ref 36.0–46.0)
Hemoglobin: 11.3 g/dL — ABNORMAL LOW (ref 12.0–15.0)
MCH: 31.2 pg (ref 26.0–34.0)
MCHC: 32.7 g/dL (ref 30.0–36.0)
MCV: 95.6 fL (ref 78.0–100.0)
Platelets: 231 10*3/uL (ref 150–400)
RBC: 3.62 MIL/uL — ABNORMAL LOW (ref 3.87–5.11)
RDW: 13.5 % (ref 11.5–15.5)
WBC: 8.5 10*3/uL (ref 4.0–10.5)

## 2014-07-08 LAB — DIFFERENTIAL
BASOS ABS: 0 10*3/uL (ref 0.0–0.1)
Basophils Relative: 0 % (ref 0–1)
EOS ABS: 0.1 10*3/uL (ref 0.0–0.7)
Eosinophils Relative: 1 % (ref 0–5)
Lymphocytes Relative: 34 % (ref 12–46)
Lymphs Abs: 2.9 10*3/uL (ref 0.7–4.0)
MONO ABS: 0.6 10*3/uL (ref 0.1–1.0)
Monocytes Relative: 7 % (ref 3–12)
NEUTROS ABS: 4.9 10*3/uL (ref 1.7–7.7)
NEUTROS PCT: 58 % (ref 43–77)

## 2014-07-08 LAB — GROUP B STREP BY PCR: GROUP B STREP BY PCR: NEGATIVE

## 2014-07-08 LAB — RUBELLA SCREEN: Rubella: 3.55 index (ref >0.99)

## 2014-07-08 LAB — RAPID HIV SCREEN (WH-MAU): SUDS RAPID HIV SCREEN: NONREACTIVE

## 2014-07-08 MED ORDER — LIDOCAINE HCL (PF) 1 % IJ SOLN
INTRAMUSCULAR | Status: DC | PRN
  Administered 2014-07-08 (×2): 8 mL

## 2014-07-08 MED ORDER — CITRIC ACID-SODIUM CITRATE 334-500 MG/5ML PO SOLN: 30.0000 mL | ORAL | Status: DC | PRN

## 2014-07-08 MED ORDER — SENNOSIDES-DOCUSATE SODIUM 8.6-50 MG PO TABS
2.0000 | ORAL_TABLET | ORAL | Status: DC
  Administered 2014-07-08: 2 via ORAL
  Filled 2014-07-08: qty 2

## 2014-07-08 MED ORDER — FENTANYL 2.5 MCG/ML BUPIVACAINE 1/10 % EPIDURAL INFUSION (WH - ANES)
INTRAMUSCULAR | Status: DC | PRN
  Administered 2014-07-08: 14 mL/h via EPIDURAL

## 2014-07-08 MED ORDER — LACTATED RINGERS IV SOLN
500.0000 mL | INTRAVENOUS | Status: DC | PRN
  Administered 2014-07-08: 500 mL via INTRAVENOUS

## 2014-07-08 MED ORDER — IBUPROFEN 600 MG PO TABS
600.0000 mg | ORAL_TABLET | Freq: Four times a day (QID) | ORAL | Status: DC
  Administered 2014-07-08 – 2014-07-09 (×5): 600 mg via ORAL
  Filled 2014-07-08 (×5): qty 1

## 2014-07-08 MED ORDER — FLEET ENEMA 7-19 GM/118ML RE ENEM: 1.0000 | ENEMA | RECTAL | Status: DC | PRN

## 2014-07-08 MED ORDER — LIDOCAINE HCL (PF) 1 % IJ SOLN
30.0000 mL | INTRAMUSCULAR | Status: DC | PRN
  Filled 2014-07-08: qty 30

## 2014-07-08 MED ORDER — OXYCODONE-ACETAMINOPHEN 5-325 MG PO TABS: 1.0000 | ORAL_TABLET | ORAL | Status: DC | PRN

## 2014-07-08 MED ORDER — OXYTOCIN BOLUS FROM INFUSION: 500.0000 mL | INTRAVENOUS | Status: DC

## 2014-07-08 MED ORDER — OXYTOCIN 40 UNITS IN LACTATED RINGERS INFUSION - SIMPLE MED: 62.5000 mL/h | INTRAVENOUS | Status: DC

## 2014-07-08 MED ORDER — DIPHENHYDRAMINE HCL 25 MG PO CAPS: 25.0000 mg | ORAL_CAPSULE | Freq: Four times a day (QID) | ORAL | Status: DC | PRN

## 2014-07-08 MED ORDER — INFLUENZA VAC SPLIT QUAD 0.5 ML IM SUSY
0.5000 mL | PREFILLED_SYRINGE | INTRAMUSCULAR | Status: AC
  Administered 2014-07-09: 0.5 mL via INTRAMUSCULAR
  Filled 2014-07-08: qty 0.5

## 2014-07-08 MED ORDER — SIMETHICONE 80 MG PO CHEW: 80.0000 mg | CHEWABLE_TABLET | ORAL | Status: DC | PRN

## 2014-07-08 MED ORDER — OXYTOCIN 40 UNITS IN LACTATED RINGERS INFUSION - SIMPLE MED: 62.5000 mL/h | INTRAVENOUS | Status: DC | PRN

## 2014-07-08 MED ORDER — TETANUS-DIPHTH-ACELL PERTUSSIS 5-2.5-18.5 LF-MCG/0.5 IM SUSP
0.5000 mL | Freq: Once | INTRAMUSCULAR | Status: AC
  Administered 2014-07-09: 0.5 mL via INTRAMUSCULAR
  Filled 2014-07-08: qty 0.5

## 2014-07-08 MED ORDER — CITRIC ACID-SODIUM CITRATE 334-500 MG/5ML PO SOLN
30.0000 mL | ORAL | Status: DC | PRN
Start: 2014-07-08 — End: 2014-07-08

## 2014-07-08 MED ORDER — EPHEDRINE 5 MG/ML INJ: 10.0000 mg | INTRAVENOUS | Status: DC | PRN

## 2014-07-08 MED ORDER — PRENATAL MULTIVITAMIN CH
1.0000 | ORAL_TABLET | Freq: Every day | ORAL | Status: DC
  Administered 2014-07-08 – 2014-07-09 (×2): 1 via ORAL
  Filled 2014-07-08 (×2): qty 1

## 2014-07-08 MED ORDER — LACTATED RINGERS IV SOLN
INTRAVENOUS | Status: DC
  Administered 2014-07-08: 125 mL/h via INTRAVENOUS
  Administered 2014-07-08: 09:00:00 via INTRAVENOUS

## 2014-07-08 MED ORDER — PHENYLEPHRINE 40 MCG/ML (10ML) SYRINGE FOR IV PUSH (FOR BLOOD PRESSURE SUPPORT)
80.0000 ug | PREFILLED_SYRINGE | INTRAVENOUS | Status: DC | PRN
  Filled 2014-07-08: qty 20

## 2014-07-08 MED ORDER — ONDANSETRON HCL 4 MG PO TABS: 4.0000 mg | ORAL_TABLET | ORAL | Status: DC | PRN

## 2014-07-08 MED ORDER — PNEUMOCOCCAL VAC POLYVALENT 25 MCG/0.5ML IJ INJ
0.5000 mL | INJECTION | INTRAMUSCULAR | Status: DC
  Filled 2014-07-08: qty 0.5

## 2014-07-08 MED ORDER — OXYTOCIN 40 UNITS IN LACTATED RINGERS INFUSION - SIMPLE MED
62.5000 mL/h | INTRAVENOUS | Status: DC
  Administered 2014-07-08: 62.5 mL/h via INTRAVENOUS
  Filled 2014-07-08: qty 1000

## 2014-07-08 MED ORDER — LANOLIN HYDROUS EX OINT: TOPICAL_OINTMENT | CUTANEOUS | Status: DC | PRN

## 2014-07-08 MED ORDER — ONDANSETRON HCL 4 MG/2ML IJ SOLN: 4.0000 mg | Freq: Four times a day (QID) | INTRAMUSCULAR | Status: DC | PRN

## 2014-07-08 MED ORDER — LACTATED RINGERS IV SOLN: 500.0000 mL | Freq: Once | INTRAVENOUS | Status: DC

## 2014-07-08 MED ORDER — WITCH HAZEL-GLYCERIN EX PADS: 1.0000 "application " | MEDICATED_PAD | CUTANEOUS | Status: DC | PRN

## 2014-07-08 MED ORDER — OXYTOCIN BOLUS FROM INFUSION
500.0000 mL | INTRAVENOUS | Status: DC
  Administered 2014-07-08: 500 mL via INTRAVENOUS

## 2014-07-08 MED ORDER — LACTATED RINGERS IV SOLN: 500.0000 mL | INTRAVENOUS | Status: DC | PRN

## 2014-07-08 MED ORDER — ACETAMINOPHEN 325 MG PO TABS: 650.0000 mg | ORAL_TABLET | ORAL | Status: DC | PRN

## 2014-07-08 MED ORDER — FENTANYL 2.5 MCG/ML BUPIVACAINE 1/10 % EPIDURAL INFUSION (WH - ANES)
14.0000 mL/h | INTRAMUSCULAR | Status: DC | PRN
  Administered 2014-07-08: 14 mL/h via EPIDURAL
  Filled 2014-07-08: qty 125

## 2014-07-08 MED ORDER — PHENYLEPHRINE 40 MCG/ML (10ML) SYRINGE FOR IV PUSH (FOR BLOOD PRESSURE SUPPORT): 80.0000 ug | PREFILLED_SYRINGE | INTRAVENOUS | Status: DC | PRN

## 2014-07-08 MED ORDER — DIPHENHYDRAMINE HCL 50 MG/ML IJ SOLN: 12.5000 mg | INTRAMUSCULAR | Status: DC | PRN

## 2014-07-08 MED ORDER — LACTATED RINGERS IV SOLN: INTRAVENOUS | Status: DC

## 2014-07-08 MED ORDER — OXYCODONE-ACETAMINOPHEN 5-325 MG PO TABS: 2.0000 | ORAL_TABLET | ORAL | Status: DC | PRN

## 2014-07-08 MED ORDER — ONDANSETRON HCL 4 MG/2ML IJ SOLN: 4.0000 mg | INTRAMUSCULAR | Status: DC | PRN

## 2014-07-08 MED ORDER — DIBUCAINE 1 % RE OINT
1.0000 "application " | TOPICAL_OINTMENT | RECTAL | Status: DC | PRN
  Filled 2014-07-08: qty 28

## 2014-07-08 MED ORDER — BENZOCAINE-MENTHOL 20-0.5 % EX AERO
1.0000 "application " | INHALATION_SPRAY | CUTANEOUS | Status: DC | PRN
  Administered 2014-07-08: 1 via TOPICAL
  Filled 2014-07-08 (×2): qty 56

## 2014-07-08 NOTE — MAU Note (Signed)
POSITIVE    FERN 

## 2014-07-08 NOTE — Anesthesia Preprocedure Evaluation (Signed)
Anesthesia Evaluation  Patient identified by MRN, date of birth, ID band Patient awake    Reviewed: Allergy & Precautions, H&P , Patient's Chart, lab work & pertinent test results  Airway Mallampati: II  TM Distance: >3 FB Neck ROM: full    Dental no notable dental hx.    Pulmonary Current Smoker,    Pulmonary exam normal       Cardiovascular hypertension, negative cardio ROS      Neuro/Psych negative neurological ROS  negative psych ROS   GI/Hepatic negative GI ROS, Neg liver ROS,   Endo/Other  Morbid obesity  Renal/GU negative Renal ROS     Musculoskeletal   Abdominal (+) + obese,   Peds  Hematology negative hematology ROS (+)   Anesthesia Other Findings   Reproductive/Obstetrics (+) Pregnancy                             Anesthesia Physical Anesthesia Plan  ASA: III  Anesthesia Plan: Epidural   Post-op Pain Management:    Induction:   Airway Management Planned:   Additional Equipment:   Intra-op Plan:   Post-operative Plan:   Informed Consent: I have reviewed the patients History and Physical, chart, labs and discussed the procedure including the risks, benefits and alternatives for the proposed anesthesia with the patient or authorized representative who has indicated his/her understanding and acceptance.     Plan Discussed with:   Anesthesia Plan Comments:         Anesthesia Quick Evaluation

## 2014-07-08 NOTE — MAU Note (Addendum)
PT  SAYS  HER SROM   AT [id number]-  CLEAR.   ONC  IN [facility]  - FOR  INCREASE  BP.    DENIES HSV AND MRSA.  GBS-  NOT DONE.   WAS IN CLINIC   LAST Tuesday.

## 2014-07-08 NOTE — Anesthesia Procedure Notes (Signed)
Epidural Patient location during procedure: OB Start time: 07/08/2014 6:14 AM End time: 07/08/2014 6:18 AM  Staffing Anesthesiologist: Leilani AbleHATCHETT, Emanual Lamountain Performed by: anesthesiologist   Preanesthetic Checklist Completed: patient identified, surgical consent, pre-op evaluation, timeout performed, IV checked, risks and benefits discussed and monitors and equipment checked  Epidural Patient position: sitting Prep: site prepped and draped and DuraPrep Patient monitoring: continuous pulse ox and blood pressure Approach: midline Location: L3-L4 Injection technique: LOR air  Needle:  Needle type: Tuohy  Needle gauge: 17 G Needle length: 9 cm and 9 Needle insertion depth: 6 cm Catheter type: closed end flexible Catheter size: 19 Gauge Catheter at skin depth: 11 cm Test dose: negative and Other  Assessment Sensory level: T9 Events: blood not aspirated, injection not painful, no injection resistance, negative IV test and no paresthesia

## 2014-07-08 NOTE — Lactation Note (Signed)
This note was copied from the chart of Shirley Tran Huie. Lactation Consultation Note  Patient Name: Shirley Tran Botkin ZOXWR'UToday's Date: 07/08/2014 Reason for consult: Initial assessment;Other (Comment) (maternal hx of drug use; no prenatal care) This is only mom's second experience with breastfeeding and her other baby received both breast and bottle-feedings for 1 month before exclusively formula feeding after that.  Her newborn has latched well and she intends to combine breast and formula/bottle feeding this time, as well.  LC encouraged exclusive breastfeeding for first 2 weeks for maximum milk production.  Mom says her nurse has shown her hand expression. (documented at 1138 by RN)  [facility] encouraged frequent STS and cue feedings.  LC reviewed possible LEAD effects of early supplementation and bottle/formula-feeding. LC also gave mom the state of [place] handout, re:"Marijuana and Breastfeeding"and mom denies any current drug use.Mom encouraged to feed baby 8-12 times/24 hours and with feeding cues. LC encouraged review of Baby and Me pp 9, 14 and 20-25 for STS and BF information. LC provided [facility] Resource brochure and reviewed [facility] services and list of community and web site resources.     Maternal Data Formula Feeding for Exclusion: Yes Reason for exclusion: Mother's choice to formula and breast feed on admission Has patient been taught Hand Expression?: Yes (documented by RN, Salena SanerJulie Potts) Does the patient have breastfeeding experience prior to this delivery?: Yes  Feeding Feeding Type: Formula Nipple Type: Slow - flow  LATCH Score/Interventions            Initial LATCH score=7 per RN assessment          Lactation Tools Discussed/Used   STS, cue feedings, hand expression LEAD cautions and cautions regarding marijuana use while breastfeeding  Consult Status Consult Status: Follow-up Date: 07/09/14 Follow-up type: In-patient    Warrick ParisianBryant, Kiandra Sanguinetti [facility] 07/08/2014, 5:13  PM

## 2014-07-08 NOTE — H&P (Signed)
Patient is 24 y.o. [id number] 3493w6d presents for premature rupture of membranes, grossly ruptured and fern test positive. PROM around 3 am 07/08/14. Denies contractions, bleeding, or decreased fetal movement.  Complications of pregnancy include no prenatal care during pregnancy and prev C/S with desire for VBAC.  History OB History    Gravida Para Term Preterm AB TAB SAB Ectopic Multiple Living   7 4 3 1 2 1 1  0 0 4     Past Medical History  Diagnosis Date  . Chlamydia 2006    treated  . Candidiasis 2010    treatment  . Cystitis   . Reflux   . Seizures 2012    only had 1 seizure  . Hypertension     out of meds, no PCP  . Complication of anesthesia     back pain  . Postpartum depression     states depression all the time, worse postpartum   Past Surgical History  Procedure Laterality Date  . Dilation and curettage of uterus    . Cesarean section N/A 07/06/2013    Procedure: Primary Cesarean Section Delivery Baby Boy@ 0352, Apgars 9/9;  Surgeon: Lazaro ArmsLuther H Eure, MD;  Location: [facility];  Service: Obstetrics;  Laterality: N/A;   Family History: family history includes Anemia in her mother; Diabetes in her maternal grandmother; Heart disease in her maternal grandmother; Sickle cell trait in her daughter; Thrombophlebitis in her maternal grandmother. Social History:  reports that she has been smoking Cigarettes.  She has a 1.75 pack-year smoking history. She has never used smokeless tobacco. She reports that she does not drink alcohol or use illicit drugs.   ROS Neg except for HPI   Blood pressure 112/69, pulse 91, temperature 98.7 F (37.1 C), temperature source Oral, resp. rate 20, height 5\' 3"  (1.6 m), weight 103.42 kg (228 lb), last menstrual period 01/07/2014, unknown if currently breastfeeding.   Fetal Exam Fetal Monitor Review: Baseline rate: 150.  Variability: moderate (6-25 bpm).   Pattern: accelerations present and late decelerations.    Fetal State Assessment: Category II  - tracings are indeterminate.     Physical Exam  Constitutional: She is oriented to person, place, and time. She appears well-developed and well-nourished. No distress.  HENT:  Head: Normocephalic and atraumatic.  Respiratory: Effort normal. No respiratory distress.  GI: Soft. She exhibits no distension.  Musculoskeletal: Normal range of motion. She exhibits no edema or tenderness.  Neurological: She is alert and oriented to person, place, and time.  Skin: Skin is warm and dry.  Psychiatric: She has a normal mood and affect. Her behavior is normal.    Prenatal labs: ABO, Rh:  pending Antibody:  pending Rubella:  pending RPR:   pending HBsAg:   pending HIV:   pending GBS:   pending  Assessment/Plan: Patient is 24 y.o. [id number] 3693w6d presents for PROM and active labor.  # active labor- expectant management  # no prenatal care-> OB lab panel pending  # pain management- epidural  # ID- GBS unknown->GBS pcr  # MOF- bottle  # MOC- IUD    Rolm BookbinderMoss, Amber 07/08/2014, 4:35 AM   I have seen and examined this patient and I agree with the above. Cam HaiSHAW, Melodee Lupe CNM 9:20 AM 07/08/2014

## 2014-07-09 LAB — HIV ANTIBODY (ROUTINE TESTING W REFLEX): HIV SCREEN 4TH GENERATION: NONREACTIVE

## 2014-07-09 LAB — RPR: RPR: NONREACTIVE

## 2014-07-09 NOTE — Progress Notes (Signed)
Clinical Social Work Department PSYCHOSOCIAL ASSESSMENT - MATERNAL/CHILD 07/09/2014  Patient:  Shirley Tran,Shirley Tran  Account Number:  [id number]  Admit Date:  07/08/2014  Childs Name:   Shirley Tran   Clinical Social Worker:  Shirley Tran, CLINICAL SOCIAL WORKER   Date/Time:  07/09/2014 08:45 AM  Date Referred:  07/08/2014   Referral source  Central Nursery     Referred reason  LPNC  Substance Abuse  Behavioral Health Issues   Other referral source:   No custody of three oldest children-- adopted after failure to comply with CPS case plan.    I:  FAMILY / HOME ENVIRONMENT Child's legal guardian:  PARENT  Guardian - Name Guardian - Age Guardian - Address  Shirley Tran 23 [address] [place], [place] [address]  Shirley Tran 17 same as above   Other household support members/support persons Name Relationship DOB  Shirley Tran SON 1 year old  Shirley Tran MOTHER    Other support:   MOB reported that the FOB and the MGM are her primary support system. She also reported that her in-home CPS worker and her [facility] case worker as positive supports.    II  PSYCHOSOCIAL DATA Information Source:  Family Interview  Financial and Community Resources Employment:   MOB is currently not working.   Financial resources:  Medicaid If Medicaid - County:  [place] Other  WIC  Food Stamps   School / Grade:  N/A Maternity Care Coordinator / Child Services Coordination / Early Interventions:   None reported  Cultural issues impacting care:   None reported    III  STRENGTHS Strengths  Adequate Resources  Home prepared for Child (including basic supplies)  Supportive family/friends   Strength comment:  MOB was open regarding her psychosocial history.   IV  RISK FACTORS AND CURRENT PROBLEMS Current Problem:  YES   Risk Factor & Current Problem Patient Issue Family Issue Risk Factor / Current Problem Comment  Mental Illness Y N MOB reported history of  depression, anxiety, and bipolar.  She reported that she will be following up with her psychiatrist as soon as possible to re-start psychotropic medications.  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  MOB endorsed using 3-4 times per week, last use one week ago.  Baby's UDS is positive and MDS is pending.  DSS Involvement Y N MOB acknowledged no custody of 3 oldest children due to failure to comply with CPS case plan.  She reported that she has an in-home CPS worker, Shirley Tran.  She reported that she receives visits 2 times per month, was previously once a week.  Other - See comment Y N No prenatal care.  MOB denied barriers to care, but shared that she just missed appointments.   N N     V  SOCIAL WORK ASSESSMENT CSW met with the MOB due to history of depression, anxiety, and THC use.  CSW also consulted due to no prenatal care during this pregnancy and reported custody loss of her first three children.  MOB presented as easily engaged and receptive to the visit.  She displayed an appropriate range in affect and was in a pleasant mood.  CSW noted no acute mental health symptoms during the visit.  The MOB presented as attending to and bonding with the infant.  MOB shared that she had been expecting a visit from CSW since her in-home social worker had prepared her for the visit.   MOB expressed normative   mixed feelings upon arrival of the infant.  She reflected upon the excitement and the stress since she has another child that is only 1 years old.  She discussed awareness that she is going to have her "hands full" once discharged.  She shared that she does positive support (she lives with the MGM) that will assist her with the transition.    MOB openly discussed history of depression, anxiety, and bipolar.  She shared that she had been receiving care at [facility], but stated that she will be transitioning to [facility] in the postpartum period since they  will be able to provide her with mental health and substance use treatment.  She discussed that prior to this pregnancy she was prescribed Zoloft, but discontinued with the +UPT since she was concerned about potential birth defects.  She expressed strong motivation to re-start medications since she has noted that she has had been "emotional", low energy, and noted that it had been difficult for her to get out of bed or engage in daily self-care during the pregnancy.  MOB discussed that she intends to contact her provider as soon as possible. MOB presents with insight on need to address her mental health as she transitions to the postpartum period since she has an increased risk of developing postpartum depression.   MOB acknowledged THC use during the pregnancy. She endorsed last use one week ago, and using THC approximately 3-4 times per week.  MOB expressed regret for her decision, but shared that she felt like she needed THC to assist her to relax.  She stated that it has been difficult for her to stay at home with her first child since she is not working and never receives a "break".  MOB acknowledged and verbalized understanding of the hospital drug screen policy. She verbalized understanding of CSW need to make CPS report since the infant's UDS is positive for THC.   MOB openly discussed current CPS involvement.  She stated that CPS became involved one year ago after her Shirley was born due to THC use, mental health needs, and no custody of her three oldest children (4,5,and 6).  She shared that it has been "helpful" for their involvement since they have helped her establish mental health care, and have also assisted her with referrals for [facility].  MOB discussed the benefits of CPS involvement since they have helped her to become a better mother.  She presented as proud of herself as she discussed her progress in her case plan, and stated that her case worker has reduced home visits to once every  two weeks.  She reported commitment and motivation to continue to comply with her current case plan since she wants to be a parent for Shirley and this infant.  She reflected regret for not working a previous case plan that resulted in removal of her three oldest children who have officially been adopted.  MOB respectfully declined offer to process this loss with CSW.  She shared that she continues to struggle with this loss, but discussed belief that she is able to talk to the FOB as needs arise.   Overall, MOB presents as coping well with numerous psychosocial stressors.  She appears to be complying with CPS mandates and is able to verbalize how services have assisted her to become the mother she wants to be. She presents with insight and self-awareness of need to engage in self-care given her heightened risk for postpartum depression.  MOB expressed   appreciation for the visit, and denied additional questions, concerns, or needs at this time.   After initial assessment, CSW was at nurses station when notified that the MOB had called to nurse's station requesting that the FOB leave the room.  CSW followed up with the MOB in order to provide emotional support after the FOB left the room.  MOB originally was guarded and did not want to talk about it, but she eventually discussed how she felt unsupported by him.  She stated that it has been difficult for her to sleep since she has been worried about him "spitting up".  She shared that she had requested that the FOB resume primary caregiver roles while she naps, but perceived him to be minimally helpful.  MOB became tearful as she discussed that she asked him to leave since she felt it would be better to have a break from one another since she did not want it to escalate to an argument. After CSW allowed MOB an opportunity to express her feelings, CSW assisted the MOB to regulate her mood.  MOB smiled as she showed CSW pictures of her one year old, and the MOB also  smiled as she reflected upon her sense of self-efficacy to improve her mood.   No barriers to discharge.  VI SOCIAL WORK PLAN Social Work Plan  Child Protective Services Report  Patient/Family Education  No Further Intervention Required / No Barriers to Discharge   Type of pt/family education:   Postpartum depresion  Hospital drug screen policy   If child protective services report - county:  [place] If child protective services report - date:  07/09/2014 Information/referral to community resources comment:   No referrals. MOB currently has mental health and substance abuse treatment at [facility] and receives parenting support via [facility].   Other social work plan:   CSW met with in-home CPS worker upon their arrival to the hospital.  In-home worker reported that the MOB will be assigned a new CPS investigator since the infant's urine was positive for THC, but she denied any barriers to discharge.  CPS investigator will follow up with MOB within 72 hours of receiving CPS report that was made this morning by CSW.  CSW will monitor MDS and will notify CPS of results.   CSW to make [facility] referral.      

## 2014-07-09 NOTE — Discharge Summary (Signed)
Patient is 24 y.o. [id number] presents for premature rupture of membranes, grossly ruptured and fern test positive. SROM around 3 am 07/08/14. Denies contractions, bleeding, or decreased fetal movement. Complications of pregnancy include no prenatal care during pregnancy and prev C/S with desire for VBAC. Delivered baby boy via precipitous SVD w/out complications. Bottle feeding and probably nexplanon for contraception. Obstetric Discharge Summary Reason for Admission: onset of labor Prenatal Procedures: none Intrapartum Procedures: spontaneous vaginal delivery Postpartum Procedures: none Complications-Operative and Postpartum: none HEMOGLOBIN  Date Value Ref Range Status  07/08/2014 11.3* 12.0 - 15.0 g/dL Final   HCT  Date Value Ref Range Status  07/08/2014 34.6* 36.0 - 46.0 % Final    Physical Exam:  General: alert, cooperative and no distress Lochia: appropriate Uterine Fundus: firm Incision: na DVT Evaluation: No evidence of DVT seen on physical exam.  Discharge Diagnoses: Term Pregnancy-delivered  Discharge Information: Date: 07/09/2014 Activity: unrestricted Diet: routine Medications: None Condition: stable Instructions: see attachment Discharge to: home   Newborn Data: Live born female  Birth Weight: 6 lb 11.6 oz (3050 g) APGAR: 8, 9  Home with mother.  Rolm BookbinderMoss, Elizeth Weinrich 07/09/2014, 6:07 AM

## 2014-07-09 NOTE — Progress Notes (Signed)
UR chart review completed.  

## 2014-07-09 NOTE — Anesthesia Postprocedure Evaluation (Signed)
Anesthesia Post Note  Patient: Shirley Tran  Procedure(s) Performed: * No procedures listed *  Anesthesia type: Epidural  Patient location: Mother/Baby  Post pain: Pain level controlled  Post assessment: Post-op Vital signs reviewed  Last Vitals:  Filed Vitals:   07/09/14 0608  BP: 125/79  Pulse: 67  Temp: 36.8 C  Resp: 20    Post vital signs: Reviewed  Level of consciousness:alert  Complications: No apparent anesthesia complications

## 2014-07-09 NOTE — Lactation Note (Signed)
This note was copied from the chart of Boy Bennett Scrapelicia Marteney. Lactation Consultation Note: Infant is 2925 hours old and is on double photo therapy. Mother states that she wants to breast feed but when she attempt she felt painful latch. Assist mother with latch in cross cradle hold. Infant suckled on and off for 5 mins.but was very sleepy Observed that mother has large amts of colostrum. Infant placed on alternate breast in football hold. Infant latched and began showing cues. Mother states this hurts and she wants to pump breast. Grandmother at bedside and began feeding infant formula with a bottle. Mother was given a hand pump with a #27 flange. Assist mother with pumping and mother obtained several mls of colostrum. Discussed supply and demand as well as commitment level to continue to pump every 2-3 hours for 15 mins on each breast. Discussed using a DEBP . Mother states she is active with WIC. Staff nurse Vivi MartensAshley Billings to set up DEBP with instructions.   Patient Name: Boy Bennett Scrapelicia Kreager MVHQI'OToday's Date: 07/09/2014 Reason for consult: Follow-up assessment   Maternal Data    Feeding Feeding Type: Breast Fed Length of feed:  (on and off for a few mins)  LATCH Score/Interventions Latch: Grasps breast easily, tongue down, lips flanged, rhythmical sucking. Intervention(s): Waking techniques Intervention(s): Adjust position;Assist with latch  Audible Swallowing: A few with stimulation Intervention(s): Hand expression (large amts of colostrum) Intervention(s): Hand expression  Type of Nipple: Everted at rest and after stimulation  Comfort (Breast/Nipple): Soft / non-tender     Hold (Positioning): Assistance needed to correctly position infant at breast and maintain latch. Intervention(s): Support Pillows;Position options  LATCH Score: 8  Lactation Tools Discussed/Used     Consult Status Consult Status: Follow-up Date: 07/09/14 Follow-up type: In-patient    Stevan BornKendrick, Faviola Klare  [facility] 07/09/2014, 1:59 PM

## 2014-07-10 ENCOUNTER — Ambulatory Visit: Payer: Self-pay

## 2014-07-10 NOTE — Lactation Note (Signed)
This note was copied from the chart of Shirley Tran. Lactation Consultation Note  Mother has decided to exclusively formula feed.  She is aware that she can call us with any needs. Patient Name: Shirley Tran [id number]'s Date: 07/10/2014     Maternal Data    Feeding Feeding Type: Formula  LATCH Score/Interventions                      Lactation Tools Discussed/Used     Consult Status      [name], [name] 07/10/2014, 9:06 AM

## 2014-07-15 ENCOUNTER — Encounter: Payer: Medicaid Other | Admitting: Family Medicine

## 2014-07-20 ENCOUNTER — Telehealth: Payer: Self-pay

## 2014-07-20 NOTE — Telephone Encounter (Signed)
Patient called stating she is concerned because she had her baby 12 days ago and her bleeding had started to slow down but over the past two days it has been dark red, heavy and experiencing some stomach cramping. Called patient who states "my bleeding had started to slow down and since yesterday it has picked up, been dark and I feel it pouring out when I stand up." Reports she is changing pad every 2 hours but does not let it get full.  Denies passing large clots. Denies severe pain, reports some cramping but states "nothing I would consider abnormal."  Patient is not breast feeding. Denies any fever. Reports staying well hydrated. Reports she holds her bladder at night but has "for the most part" has been emptying her bladder regularly every couple of hours. Informed patient that her bleeding is not too concerning at this point. Advised she continue to monitor and come to MAU should her bleeding increase to the point where she is saturating a pad an hour, passing large clots, severe pain/cramping, fever etc. Advised she continue to stay well hydrated and walk/move around as often/best she can. Patient verbalized understanding and gratitude. No further questions or concerns.

## 2014-08-18 ENCOUNTER — Ambulatory Visit: Payer: Medicaid Other | Admitting: Obstetrics & Gynecology

## 2014-09-02 ENCOUNTER — Ambulatory Visit (INDEPENDENT_AMBULATORY_CARE_PROVIDER_SITE_OTHER): Payer: Medicaid Other | Admitting: Obstetrics & Gynecology

## 2014-09-02 ENCOUNTER — Other Ambulatory Visit (HOSPITAL_COMMUNITY)
Admission: RE | Admit: 2014-09-02 | Discharge: 2014-09-02 | Disposition: A | Discharge status: Home / Self Care | Payer: Medicaid Other | Source: Ambulatory Visit | Attending: Obstetrics & Gynecology | Admitting: Obstetrics & Gynecology

## 2014-09-02 ENCOUNTER — Encounter: Payer: Self-pay | Admitting: Obstetrics & Gynecology

## 2014-09-02 VITALS — BP 135/87 | HR 80 | Temp 98.0°F | Ht 63.0 in | Wt 198.4 lb

## 2014-09-02 DIAGNOSIS — Z01419 Encounter for gynecological examination (general) (routine) without abnormal findings: Secondary | ICD-10-CM | POA: Diagnosis present

## 2014-09-02 DIAGNOSIS — Z0000 Encounter for general adult medical examination without abnormal findings: Secondary | ICD-10-CM

## 2014-09-02 DIAGNOSIS — Z01812 Encounter for preprocedural laboratory examination: Secondary | ICD-10-CM

## 2014-09-02 DIAGNOSIS — Z113 Encounter for screening for infections with a predominantly sexual mode of transmission: Secondary | ICD-10-CM | POA: Insufficient documentation

## 2014-09-02 MED ORDER — ETONOGESTREL-ETHINYL ESTRADIOL 0.12-0.015 MG/24HR VA RING: VAGINAL_RING | VAGINAL | Status: DC

## 2014-09-02 NOTE — Progress Notes (Signed)
  Subjective:     Shirley Tran is a 24 y.o.S AA P4 female who presents for a postpartum visit. She is 6 weeks postpartum following a spontaneous vaginal delivery. I have fully reviewed the prenatal and intrapartum course. The delivery was at 37 gestational weeks. Outcome: vaginal birth after cesarean (VBAC). Anesthesia: epidural. Postpartum course has been normal. Baby's course has been normal. Baby is feeding by bottle - Enfamil AR. Bleeding on her period now. Bowel function is normal. Bladder function is normal. Patient is not sexually active. Contraception method is none now but she would like permanent sterility. Postpartum depression screening: negative.  The following portions of the patient's history were reviewed and updated as appropriate: allergies, current medications, past family history, past medical history, past social history, past surgical history and problem list.  Review of Systems Pertinent items are noted in HPI.   Objective:    BP 135/87 mmHg  Pulse 80  Temp(Src) 98 F (36.7 C)  Ht 5\' 3"  (1.6 m)  Wt 198 lb 6.4 oz (89.994 kg)  BMI 35.15 kg/m2  Breastfeeding? No  General:  alert   Breasts:  inspection negative, no nipple discharge or bleeding, no masses or nodularity palpable  Lungs: clear to auscultation bilaterally  Heart:  regular rate and rhythm, S1, S2 normal, no murmur, click, rub or gallop  Abdomen: soft, non-tender; bowel sounds normal; no masses,  no organomegaly   Vulva:  normal  Vagina: normal vagina  Cervix:  anteverted  Corpus: normal  Adnexa:  normal adnexa  Rectal Exam: Not performed.        Assessment:     Normal postpartum exam. Pap smear done at today's visit.   Plan:    1. Contraception: NuvaRing vaginal inserts 2. She signed her Medicaid forms today for a L/S BS. I sent [place] a email to schedule it 3. Follow up in: 2 weeks or as needed.  for BP check. I told her that if the Nuvaring won't stay in, then she will come back for depo  provera.

## 2014-09-02 NOTE — Progress Notes (Signed)
Patient here today for PP visit. Desires nuva ring or nexplanon-- undecided. Denies having any sexual intercourse since delivery of baby.  UPT obtained.

## 2014-09-03 LAB — CYTOLOGY - PAP

## 2014-09-15 ENCOUNTER — Encounter: Payer: Self-pay | Admitting: *Deleted

## 2014-10-19 ENCOUNTER — Encounter (HOSPITAL_COMMUNITY): Payer: Self-pay | Admitting: Anesthesiology

## 2014-10-19 NOTE — Anesthesia Preprocedure Evaluation (Deleted)
Anesthesia Evaluation  Patient identified by MRN, date of birth, ID band Patient awake    Reviewed: Allergy & Precautions, NPO status , Patient's Chart, lab work & pertinent test results, reviewed documented beta blocker date and time   Airway        Dental   Pulmonary Current Smoker (2ppd),          Cardiovascular hypertension, Pt. on medications     Neuro/Psych Depression    GI/Hepatic negative GI ROS, Neg liver ROS,   Endo/Other    Renal/GU      Musculoskeletal   Abdominal   Peds  Hematology 11/35 07/2014   Anesthesia Other Findings   Reproductive/Obstetrics                             Anesthesia Physical Anesthesia Plan  ASA: II  Anesthesia Plan: General   Post-op Pain Management:    Induction: Intravenous  Airway Management Planned: Oral ETT  Additional Equipment:   Intra-op Plan:   Post-operative Plan: Extubation in OR  Informed Consent: I have reviewed the patients History and Physical, chart, labs and discussed the procedure including the risks, benefits and alternatives for the proposed anesthesia with the patient or authorized representative who has indicated his/her understanding and acceptance.     Plan Discussed with:   Anesthesia Plan Comments:         Anesthesia Quick Evaluation

## 2014-10-21 ENCOUNTER — Encounter (HOSPITAL_COMMUNITY): Admission: RE | Payer: Self-pay | Source: Ambulatory Visit

## 2014-10-21 ENCOUNTER — Ambulatory Visit (HOSPITAL_COMMUNITY)
Admission: RE | Admit: 2014-10-21 | Payer: Medicaid Other | Source: Ambulatory Visit | Admitting: Obstetrics & Gynecology

## 2014-10-21 SURGERY — SALPINGECTOMY, BILATERAL, LAPAROSCOPIC
Anesthesia: Choice | Site: Abdomen | Laterality: Bilateral

## 2014-10-21 MED ORDER — PHENYLEPHRINE 40 MCG/ML (10ML) SYRINGE FOR IV PUSH (FOR BLOOD PRESSURE SUPPORT)
PREFILLED_SYRINGE | INTRAVENOUS | Status: AC
  Filled 2014-10-21: qty 10

## 2014-10-21 MED ORDER — PROPOFOL 10 MG/ML IV BOLUS
INTRAVENOUS | Status: AC
  Filled 2014-10-21: qty 20

## 2014-10-21 MED ORDER — LIDOCAINE HCL (CARDIAC) 20 MG/ML IV SOLN
INTRAVENOUS | Status: AC
  Filled 2014-10-21: qty 5

## 2014-10-21 MED ORDER — FENTANYL CITRATE (PF) 250 MCG/5ML IJ SOLN
INTRAMUSCULAR | Status: AC
  Filled 2014-10-21: qty 5

## 2014-10-21 MED ORDER — MIDAZOLAM HCL 2 MG/2ML IJ SOLN
INTRAMUSCULAR | Status: AC
  Filled 2014-10-21: qty 2

## 2014-10-21 MED ORDER — ONDANSETRON HCL 4 MG/2ML IJ SOLN
INTRAMUSCULAR | Status: AC
  Filled 2014-10-21: qty 2

## 2014-11-05 ENCOUNTER — Ambulatory Visit (HOSPITAL_COMMUNITY): Admission: AD | Admit: 2014-11-05 | Payer: Medicaid Other | Source: Ambulatory Visit | Admitting: Surgery

## 2015-10-20 ENCOUNTER — Encounter (HOSPITAL_COMMUNITY): Payer: Self-pay | Admitting: *Deleted

## 2015-10-20 ENCOUNTER — Inpatient Hospital Stay (EMERGENCY_DEPARTMENT_HOSPITAL)
Admission: AD | Admit: 2015-10-20 | Discharge: 2015-10-20 | Disposition: A | Discharge status: Home / Self Care | Payer: Medicaid Other | Source: Ambulatory Visit | Attending: Obstetrics & Gynecology | Admitting: Obstetrics & Gynecology

## 2015-10-20 ENCOUNTER — Inpatient Hospital Stay (HOSPITAL_COMMUNITY): Payer: Medicaid Other

## 2015-10-20 ENCOUNTER — Inpatient Hospital Stay (HOSPITAL_COMMUNITY)
Admission: AD | Admit: 2015-10-20 | Discharge: 2015-10-20 | Disposition: A | Discharge status: Home / Self Care | Payer: Medicaid Other | Source: Ambulatory Visit | Attending: Obstetrics & Gynecology | Admitting: Obstetrics & Gynecology

## 2015-10-20 DIAGNOSIS — A5901 Trichomonal vulvovaginitis: Secondary | ICD-10-CM | POA: Diagnosis not present

## 2015-10-20 DIAGNOSIS — B3731 Acute candidiasis of vulva and vagina: Secondary | ICD-10-CM

## 2015-10-20 DIAGNOSIS — B373 Candidiasis of vulva and vagina: Secondary | ICD-10-CM | POA: Diagnosis not present

## 2015-10-20 DIAGNOSIS — O200 Threatened abortion: Secondary | ICD-10-CM | POA: Insufficient documentation

## 2015-10-20 DIAGNOSIS — B9689 Other specified bacterial agents as the cause of diseases classified elsewhere: Secondary | ICD-10-CM

## 2015-10-20 DIAGNOSIS — O10011 Pre-existing essential hypertension complicating pregnancy, first trimester: Secondary | ICD-10-CM | POA: Insufficient documentation

## 2015-10-20 DIAGNOSIS — F17210 Nicotine dependence, cigarettes, uncomplicated: Secondary | ICD-10-CM | POA: Diagnosis not present

## 2015-10-20 DIAGNOSIS — O98311 Other infections with a predominantly sexual mode of transmission complicating pregnancy, first trimester: Secondary | ICD-10-CM | POA: Insufficient documentation

## 2015-10-20 DIAGNOSIS — O99331 Smoking (tobacco) complicating pregnancy, first trimester: Secondary | ICD-10-CM | POA: Diagnosis not present

## 2015-10-20 DIAGNOSIS — R109 Unspecified abdominal pain: Secondary | ICD-10-CM

## 2015-10-20 DIAGNOSIS — O10911 Unspecified pre-existing hypertension complicating pregnancy, first trimester: Secondary | ICD-10-CM

## 2015-10-20 DIAGNOSIS — O209 Hemorrhage in early pregnancy, unspecified: Secondary | ICD-10-CM | POA: Insufficient documentation

## 2015-10-20 DIAGNOSIS — Z3A01 Less than 8 weeks gestation of pregnancy: Secondary | ICD-10-CM

## 2015-10-20 DIAGNOSIS — O98811 Other maternal infectious and parasitic diseases complicating pregnancy, first trimester: Secondary | ICD-10-CM | POA: Insufficient documentation

## 2015-10-20 DIAGNOSIS — O4690 Antepartum hemorrhage, unspecified, unspecified trimester: Secondary | ICD-10-CM

## 2015-10-20 DIAGNOSIS — N760 Acute vaginitis: Secondary | ICD-10-CM

## 2015-10-20 DIAGNOSIS — A599 Trichomoniasis, unspecified: Secondary | ICD-10-CM

## 2015-10-20 DIAGNOSIS — O26899 Other specified pregnancy related conditions, unspecified trimester: Secondary | ICD-10-CM

## 2015-10-20 LAB — CBC
HCT: 39.4 % (ref 36.0–46.0)
HEMATOCRIT: 42.8 % (ref 36.0–46.0)
Hemoglobin: 14.7 g/dL (ref 12.0–15.0)
Hemoglobin: 13.6 g/dL (ref 12.0–15.0)
MCH: 33.6 pg (ref 26.0–34.0)
MCH: 33.7 pg (ref 26.0–34.0)
MCHC: 34.3 g/dL (ref 30.0–36.0)
MCHC: 34.5 g/dL (ref 30.0–36.0)
MCV: 97.9 fL (ref 78.0–100.0)
MCV: 97.8 fL (ref 78.0–100.0)
PLATELETS: 273 10*3/uL (ref 150–400)
Platelets: 300 10*3/uL (ref 150–400)
RBC: 4.37 MIL/uL (ref 3.87–5.11)
RBC: 4.03 MIL/uL (ref 3.87–5.11)
RDW: 13 % (ref 11.5–15.5)
RDW: 13 % (ref 11.5–15.5)
WBC: 11.9 10*3/uL — AB (ref 4.0–10.5)
WBC: 11.7 10*3/uL — ABNORMAL HIGH (ref 4.0–10.5)

## 2015-10-20 LAB — URINALYSIS, ROUTINE W REFLEX MICROSCOPIC
Glucose, UA: NEGATIVE mg/dL
KETONES UR: NEGATIVE mg/dL
LEUKOCYTES UA: NEGATIVE
Nitrite: NEGATIVE
Protein, ur: 30 mg/dL — AB
SPECIFIC GRAVITY, URINE: 1.02 (ref 1.005–1.030)
pH: 6.5 (ref 5.0–8.0)

## 2015-10-20 LAB — URINE MICROSCOPIC-ADD ON

## 2015-10-20 LAB — WET PREP, GENITAL: Sperm: NONE SEEN

## 2015-10-20 LAB — HCG, QUANTITATIVE, PREGNANCY: HCG, BETA CHAIN, QUANT, S: 11427 m[IU]/mL — AB (ref <5)

## 2015-10-20 LAB — TYPE AND SCREEN
ABO/RH(D): O POS
Antibody Screen: NEGATIVE

## 2015-10-20 LAB — POCT PREGNANCY, URINE: PREG TEST UR: POSITIVE — AB

## 2015-10-20 MED ORDER — METRONIDAZOLE 500 MG PO TABS: 500.0000 mg | ORAL_TABLET | Freq: Two times a day (BID) | ORAL | Status: AC

## 2015-10-20 MED ORDER — LABETALOL HCL 100 MG PO TABS: 100.0000 mg | ORAL_TABLET | Freq: Two times a day (BID) | ORAL | Status: AC

## 2015-10-20 MED ORDER — METRONIDAZOLE 500 MG PO TABS: 2000.0000 mg | ORAL_TABLET | Freq: Once | ORAL | Status: DC

## 2015-10-20 MED ORDER — TERCONAZOLE 0.8 % VA CREA: 1.0000 | TOPICAL_CREAM | Freq: Every day | VAGINAL | Status: AC

## 2015-10-20 MED ORDER — METRONIDAZOLE 500 MG PO TABS
2000.0000 mg | ORAL_TABLET | Freq: Once | ORAL | Status: AC
  Administered 2015-10-20: 2000 mg via ORAL
  Filled 2015-10-20: qty 4

## 2015-10-20 NOTE — MAU Note (Signed)
Pt reports she was seen here earlier and her bleeding has increased and her stomach is cramping. States she thinks she is having a miscarriage.

## 2015-10-20 NOTE — MAU Note (Signed)
Patient was using Nuvaring and had her last period approx. 09/13/15.  She had some cramping and spotting for past two days and took a +HPT yesterday.

## 2015-10-20 NOTE — MAU Provider Note (Signed)
History     CSN: [id number]  Arrival date and time: 10/20/15 1406   First Provider Initiated Contact with Patient 10/20/15 1459        Chief Complaint  Patient presents with  . Vaginal Bleeding  . Abdominal Pain   HPI  Shirley Tran is a 25 y.o. [id number] at [redacted]w[redacted]d by LMP who presents for abdominal & vaginal bleeding. Symptoms began yesterday. Vaginal bleeding initially was pink spotting but today was dark red in underwear; has not had to wear pad and no clots.  Abdominal cramping that comes & goes. Rates 5/10. Has not take anything for it.  Some n/v, vomited once today. Not currently nauseated.  Denies diarrhea, constipation, or urinary complaints.    OB History    Gravida Para Term Preterm AB TAB SAB Ectopic Multiple Living   8 5 4 1 2 1 1  0 0 5      Past Medical History  Diagnosis Date  . Chlamydia 2006    treated  . Candidiasis 2010    treatment  . Cystitis   . Reflux   . Seizures (HCC) 2012    only had 1 seizure  . Hypertension     out of meds, no PCP  . Complication of anesthesia     back pain  . Postpartum depression     states depression all the time, worse postpartum    Past Surgical History  Procedure Laterality Date  . Dilation and curettage of uterus    . Cesarean section N/A 07/06/2013    Procedure: Primary Cesarean Section Delivery Baby Boy@ 0352, Apgars 9/9;  Surgeon: Lazaro Arms, MD;  Location: [facility];  Service: Obstetrics;  Laterality: N/A;    Family History  Problem Relation Age of Onset  . Heart disease Maternal Grandmother   . Thrombophlebitis Maternal Grandmother   . Diabetes Maternal Grandmother   . Anemia Mother   . Sickle cell trait Daughter     Social History  Substance Use Topics  . Smoking status: Current Every Day Smoker -- 0.25 packs/day for 7 years    Types: Cigarettes  . Smokeless tobacco: Never Used     Comment: 12/25/12 smokes Black mild-about 1 per day  . Alcohol Use: Yes     Comment: liquor on weekeneds, beer during  week    Allergies:  Allergies  Allergen Reactions  . Other Itching    Grass causes itching and swelling.  . Wheat Bran Hives  . Elidel [Pimecrolimus] Itching, Swelling and Rash  . Nickel Rash    Prescriptions prior to admission  Medication Sig Dispense Refill Last Dose  . Naproxen Sodium (ALEVE PO) Take 1 tablet by mouth as needed (headache).   Past Month at Unknown time    Review of Systems  Constitutional: Negative.   Gastrointestinal: Positive for vomiting and abdominal pain. Negative for nausea, diarrhea and constipation.  Genitourinary: Negative for dysuria.       + vaginal bleeding   Physical Exam   Blood pressure 151/103, temperature 98.2 F (36.8 C), temperature source Oral, resp. rate 16, height 5\' 3"  (1.6 m), weight 147 lb (66.679 kg), last menstrual period 09/13/2015, SpO2 100 %, not currently breastfeeding.  Physical Exam  Nursing note and vitals reviewed. Constitutional: She is oriented to person, place, and time. She appears well-developed and well-nourished. No distress.  HENT:  Head: Normocephalic and atraumatic.  Eyes: Conjunctivae are normal. Right eye exhibits no discharge. Left eye exhibits no discharge. No scleral icterus.  Neck: Normal range of motion.  Cardiovascular: Normal rate, regular rhythm and normal heart sounds.   No murmur heard. Respiratory: Effort normal and breath sounds normal. No respiratory distress. She has no wheezes.  GI: Soft. She exhibits no distension. There is no tenderness.  Genitourinary: Uterus normal. Cervix exhibits no motion tenderness and no friability. Right adnexum displays no mass, no tenderness and no fullness. Left adnexum displays no mass, no tenderness and no fullness. There is bleeding (small amount of dark red blood) in the vagina.  Cervix closed  Neurological: She is alert and oriented to person, place, and time.  Skin: Skin is warm and dry. She is not diaphoretic.  Psychiatric: She has a normal mood and affect.  Her behavior is normal. Judgment and thought content normal.    MAU Course  Procedures Results for orders placed or performed during the hospital encounter of 10/20/15 (from the past 24 hour(s))  Urinalysis, Routine w reflex microscopic (not at [facility])     Status: Abnormal   Collection Time: 10/20/15  2:20 PM  Result Value Ref Range   Color, Urine AMBER (A) YELLOW   APPearance CLEAR CLEAR   Specific Gravity, Urine 1.020 1.005 - 1.030   pH 6.5 5.0 - 8.0   Glucose, UA NEGATIVE NEGATIVE mg/dL   Hgb urine dipstick LARGE (A) NEGATIVE   Bilirubin Urine SMALL (A) NEGATIVE   Ketones, ur NEGATIVE NEGATIVE mg/dL   Protein, ur 30 (A) NEGATIVE mg/dL   Nitrite NEGATIVE NEGATIVE   Leukocytes, UA NEGATIVE NEGATIVE  Urine microscopic-add on     Status: Abnormal   Collection Time: 10/20/15  2:20 PM  Result Value Ref Range   Squamous Epithelial / LPF 0-5 (A) NONE SEEN   WBC, UA 0-5 0 - 5 WBC/hpf   RBC / HPF 0-5 0 - 5 RBC/hpf   Bacteria, UA FEW (A) NONE SEEN   Trichomonas, UA PRESENT    Urine-Other MUCOUS PRESENT   Pregnancy, urine POC     Status: Abnormal   Collection Time: 10/20/15  2:51 PM  Result Value Ref Range   Preg Test, Ur POSITIVE (A) NEGATIVE  CBC     Status: Abnormal   Collection Time: 10/20/15  3:04 PM  Result Value Ref Range   WBC 11.9 (H) 4.0 - 10.5 K/uL   RBC 4.37 3.87 - 5.11 MIL/uL   Hemoglobin 14.7 12.0 - 15.0 g/dL   HCT 16.1 09.6 - 04.5 %   MCV 97.9 78.0 - 100.0 fL   MCH 33.6 26.0 - 34.0 pg   MCHC 34.3 30.0 - 36.0 g/dL   RDW 40.9 81.1 - 91.4 %   Platelets 300 150 - 400 K/uL  hCG, quantitative, pregnancy     Status: Abnormal   Collection Time: 10/20/15  3:08 PM  Result Value Ref Range   hCG, Beta Chain, Quant, S 11427 (H) <5 mIU/mL  Wet prep, genital     Status: Abnormal   Collection Time: 10/20/15  3:58 PM  Result Value Ref Range   Yeast Wet Prep HPF POC PRESENT (A) NONE SEEN   Trich, Wet Prep PRESENT (A) NONE SEEN   Clue Cells Wet Prep HPF POC PRESENT (A) NONE  SEEN   WBC, Wet Prep HPF POC MODERATE (A) NONE SEEN   Sperm NONE SEEN    US Ob Comp Less 14 Wks  10/20/2015  CLINICAL DATA:  Vaginal spotting and pelvic cramping for the past 2 days. Five weeks and 2 days pregnant by last menstrual period.  EXAM: OBSTETRIC <14 WK ULTRASOUND TECHNIQUE: Transabdominal ultrasound was performed for evaluation of the gestation as well as the maternal uterus and adnexal regions. COMPARISON:  04/28/2014. FINDINGS: Intrauterine gestational sac: Visualized/normal in appearance. Located in the lower uterine segment. Yolk sac:  Not visualized Embryo:  Not visualized MSD: 10.8  mm   5 w   6  d          US EDC: 06/15/2016 Subchorionic hemorrhage:  None visualized. Maternal uterus/adnexae: Normal appearing ovaries. No free peritoneal fluid. IMPRESSION: Intrauterine gestational sac with an estimated gestational age of [redacted] weeks and 6 days. No yolk sac, fetal pole, or cardiac activity yet visualized. Recommend follow-up quantitative B-HCG levels and follow-up US in 14 days to confirm and assess viability. This recommendation follows SRU consensus guidelines: Diagnostic Criteria for Nonviable Pregnancy Early in the First Trimester. Malva Limes Engl J Med 2013; 161:0960-45; 369:1443-51. Electronically Signed   By: Beckie SaltsSteven  Reid M.D.   On: 10/20/2015 15:43   [place] Ob Transvaginal  10/20/2015  CLINICAL DATA:  Vaginal spotting and pelvic cramping for the past 2 days. Five weeks and 2 days pregnant by last menstrual period. EXAM: OBSTETRIC <14 WK ULTRASOUND TECHNIQUE: Transabdominal ultrasound was performed for evaluation of the gestation as well as the maternal uterus and adnexal regions. COMPARISON:  04/28/2014. FINDINGS: Intrauterine gestational sac: Visualized/normal in appearance. Located in the lower uterine segment. Yolk sac:  Not visualized Embryo:  Not visualized MSD: 10.8  mm   5 w   6  d          US EDC: 06/15/2016 Subchorionic hemorrhage:  None visualized. Maternal uterus/adnexae: Normal appearing ovaries. No free  peritoneal fluid. IMPRESSION: Intrauterine gestational sac with an estimated gestational age of [redacted] weeks and 6 days. No yolk sac, fetal pole, or cardiac activity yet visualized. Recommend follow-up quantitative B-HCG levels and follow-up US in 14 days to confirm and assess viability. This recommendation follows SRU consensus guidelines: Diagnostic Criteria for Nonviable Pregnancy Early in the First Trimester. Malva Limes Engl J Med 2013; 409:8119-14; 369:1443-51. Electronically Signed   By: Beckie SaltsSteven  Reid M.D.   On: 10/20/2015 15:43    MDM UPT positive O positive Pt has hx of CHTN; has not been on meds in over a year since she doesn't have a PCP. Denies chest pain, SOB, or headache Ultrasound shows IUGS, no yolk sac Will start pt on labetalol & have her return in 48 hrs for BHCG & BP check Flagyl 2 gm PO in MAU for trich tx Assessment and Plan  A: 1. Trichomoniasis   2. Vaginal bleeding in pregnancy, first trimester   3. Abdominal pain affecting pregnancy   4. BV (bacterial vaginosis)   5. Vaginal yeast infection   6. Threatened miscarriage in early pregnancy   7. Chronic hypertension in pregnancy, first trimester    P: Discharge home Rx flagyl 500 mg bid x 7 days Rx terazol Rx labetalol 100 mg BID GC/CT pending No intercourse x 1 weeks s/p treatment Return in 48 hrs for BHCG Discussed reasons to return to MAU for S/s of SAB or ectopic  Judeth Hornrin Sanah Kraska 10/20/2015, 2:58 PM

## 2015-10-20 NOTE — Discharge Instructions (Signed)

## 2015-10-20 NOTE — MAU Provider Note (Signed)
History   First Provider Initiated Contact with Patient 10/20/15 2044     Chief Complaint:  Abdominal Pain and Vaginal Bleeding   Shirley Tran is  24 y.o. [id number] Patient's last menstrual period was 09/13/2015 (approximate).. Patient is here for worsening vaginal bleeding. She is [redacted]w[redacted]d weeks gestation  by LMP.    Since her last visit, the patient is with new complaint.     Pt reports she was seen here earlier and her bleeding has increased and her stomach is cramping. States she thinks she is having a miscarriage  ROS Abdomin Pain: Moderate cramping Vaginal bleeding: similar to period.   Passage of clots or tissue: None Dizziness: None  Her previous Quantitative HCG values are:  Results for KENYA, KOOK (MRN [id number]) as of 10/25/2015 14:46  Ref. Range 10/20/2015 15:08  HCG, Beta Chain, Quant, S Latest Ref Range: <5 mIU/mL 11427 (H)    Physical Exam   BP 133/80 mmHg  Pulse 96  Temp(Src) 98.4 F (36.9 C) (Oral)  Resp 15  Ht  (1.6 m)  Wt 148 lb (67.132 kg)  BMI 26.22 kg/m2  SpO2 100%  LMP 09/13/2015 (Approximate) Constitutional: Well-nourished female in no apparent distress. No pallor Neuro: Alert and oriented 4 Cardiovascular: Normal rate Respiratory: Normal effort and rate Abdomen: Soft, nontender Gynecological Exam: Small amount of bright red blood on pad.   Labs: Results for orders placed or performed during the hospital encounter of 10/20/15 (from the past 24 hour(s))  CBC   Collection Time: 10/20/15  8:49 PM  Result Value Ref Range   WBC 11.7 (H) 4.0 - 10.5 K/uL   RBC 4.03 3.87 - 5.11 MIL/uL   Hemoglobin 13.6 12.0 - 15.0 g/dL   HCT 13.0 86.5 - 78.4 %   MCV 97.8 78.0 - 100.0 fL   MCH 33.7 26.0 - 34.0 pg   MCHC 34.5 30.0 - 36.0 g/dL   RDW 69.6 29.5 - 28.4 %   Platelets 273 150 - 400 K/uL  Type and screen   Collection Time: 10/20/15  8:49 PM  Result Value Ref Range   ABO/RH(D) O POS    Antibody Screen NEG    Sample Expiration 10/23/2015      Ultrasound Studies:   US Ob Comp Less 14 Wks  10/20/2015  CLINICAL DATA:  Vaginal spotting and pelvic cramping for the past 2 days. Five weeks and 2 days pregnant by last menstrual period. EXAM: OBSTETRIC <14 WK ULTRASOUND TECHNIQUE: Transabdominal ultrasound was performed for evaluation of the gestation as well as the maternal uterus and adnexal regions. COMPARISON:  04/28/2014. FINDINGS: Intrauterine gestational sac: Visualized/normal in appearance. Located in the lower uterine segment. Yolk sac:  Not visualized Embryo:  Not visualized MSD: 10.8  mm   5 w   6  d          [place] EDC: 06/15/2016 Subchorionic hemorrhage:  None visualized. Maternal uterus/adnexae: Normal appearing ovaries. No free peritoneal fluid. IMPRESSION: Intrauterine gestational sac with an estimated gestational age of [redacted] weeks and 6 days. No yolk sac, fetal pole, or cardiac activity yet visualized. Recommend follow-up quantitative B-HCG levels and follow-up US in 14 days to confirm and assess viability. This recommendation follows SRU consensus guidelines: Diagnostic Criteria for Nonviable Pregnancy Early in the First Trimester. Malva Limes Med 2013; 132:4401-02. Electronically Signed   By: Beckie Salts M.D.   On: 10/20/2015 15:43   US Ob Transvaginal  10/20/2015  CLINICAL DATA:  Pregnant patient in first-trimester pregnancy  with increased vaginal bleeding from earlier this day. EXAM: TRANSVAGINAL OB ULTRASOUND TECHNIQUE: Transvaginal ultrasound was performed for complete evaluation of the gestation as well as the maternal uterus, adnexal regions, and pelvic cul-de-sac. COMPARISON:  Obstetric ultrasound earlier this day at 1517 hour 6 hours prior. FINDINGS: Intrauterine gestational sac: Single, located in the lower uterine segment. Yolk sac:  Not visualized. Embryo:  Not visualized. Cardiac Activity: Not visualized. MSD: 11.5  mm   6 w   0  d Subchorionic hemorrhage:  None visualized. Maternal uterus/adnexae: Both ovaries are visualized and  normal. No pelvic free fluid. IMPRESSION: Intrauterine gestational sac is located in the lower uterine segment. Yolk sac, fetal pole or cardiac activity is not seen. Gestational sac location is suspicious but not diagnostic for failed pregnancy. Recommend trending of beta HCG and sonographic follow-up in 10-14 days. Electronically Signed   By: Rubye OaksMelanie  Ehinger M.D.   On: 10/20/2015 21:21   [place] Ob Transvaginal  10/20/2015  CLINICAL DATA:  Vaginal spotting and pelvic cramping for the past 2 days. Five weeks and 2 days pregnant by last menstrual period. EXAM: OBSTETRIC <14 WK ULTRASOUND TECHNIQUE: Transabdominal ultrasound was performed for evaluation of the gestation as well as the maternal uterus and adnexal regions. COMPARISON:  04/28/2014. FINDINGS: Intrauterine gestational sac: Visualized/normal in appearance. Located in the lower uterine segment. Yolk sac:  Not visualized Embryo:  Not visualized MSD: 10.8  mm   5 w   6  d          US EDC: 06/15/2016 Subchorionic hemorrhage:  None visualized. Maternal uterus/adnexae: Normal appearing ovaries. No free peritoneal fluid. IMPRESSION: Intrauterine gestational sac with an estimated gestational age of [redacted] weeks and 6 days. No yolk sac, fetal pole, or cardiac activity yet visualized. Recommend follow-up quantitative B-HCG levels and follow-up US in 14 days to confirm and assess viability. This recommendation follows SRU consensus guidelines: Diagnostic Criteria for Nonviable Pregnancy Early in the First Trimester. Malva Limes Engl J Med 2013; 161:0960-45; 369:1443-51. Electronically Signed   By: Beckie SaltsSteven  Reid M.D.   On: 10/20/2015 15:43    MAU course/MDM: Transvaginal US ordered  Pain and bleeding in early pregnancy with pregnancy of unknown anatomic location and hemodynamically stable. Pt stable-appearing. Very low suspicion for ruptured ectopic. More   Assessment:  1. Threatened miscarriage in early pregnancy   2. Vaginal bleeding during pregnancy, antepartum    Plan: Discharge  home in stable condition. SAB and ectopic  precautions     Follow-up Information    [facility] On [facility]22/2015.   Why:  For repeat blood work or sooner As needed in emergencies   Contact information:   [address] [id number] [place] [id number] [phone]       Medication List    TAKE these medications        ALEVE PO  Take 2 tablets by mouth daily as needed (pain).     labetalol 100 MG tablet  Commonly known as:  NORMODYNE  Take 1 tablet (100 mg total) by mouth 2 (two) times daily.     metroNIDAZOLE 500 MG tablet  Commonly known as:  FLAGYL  Take 1 tablet (500 mg total) by mouth 2 (two) times daily.     terconazole 0.8 % vaginal cream  Commonly known as:  TERAZOL 3  Place 1 applicator vaginally at bedtime.        Dorathy KinsmanVirginia Annalycia Done, CNM 10/20/2015, 10:24 PM  2/3

## 2015-10-20 NOTE — Discharge Instructions (Signed)
Return to care   If you have heavier bleeding that soaks through more that 2 pads per hour for an hour or more  If you bleed so much that you feel like you might pass out or you do pass out  If you have significant abdominal pain that is not improved with Tylenol   If you develop a fever > 100.5     Hypertension During Pregnancy Hypertension, or high blood pressure, is when there is extra pressure inside your blood vessels that carry blood from the heart to the rest of your body (arteries). It can happen at any time in life, including pregnancy. Hypertension during pregnancy can cause problems for you and your baby. Your baby might not weigh as much as he or she should at birth or might be born early (premature). Very bad cases of hypertension during pregnancy can be life-threatening.  Different types of hypertension can occur during pregnancy. These include:  Chronic hypertension. This happens when a woman has hypertension before pregnancy and it continues during pregnancy.  Gestational hypertension. This is when hypertension develops during pregnancy.  Preeclampsia or toxemia of pregnancy. This is a very serious type of hypertension that develops only during pregnancy. It affects the whole body and can be very dangerous for both mother and baby.  Gestational hypertension and preeclampsia usually go away after your baby is born. Your blood pressure will likely stabilize within 6 weeks. Women who have hypertension during pregnancy have a greater chance of developing hypertension later in life or with future pregnancies. RISK FACTORS There are certain factors that make it more likely for you to develop hypertension during pregnancy. These include:  Having hypertension before pregnancy.  Having hypertension during a previous pregnancy.  Being overweight.  Being older than 40 years.  Being pregnant with more than one baby.  Having diabetes or kidney problems. SIGNS AND  SYMPTOMS Chronic and gestational hypertension rarely cause symptoms. Preeclampsia has symptoms, which may include:  Increased protein in your urine. Your health care provider will check for this at every prenatal visit.  Swelling of your hands and face.  Rapid weight gain.  Headaches.  Visual changes.  Being bothered by light.  Abdominal pain, especially in the upper right area.  Chest pain.  Shortness of breath.  Increased reflexes.  Seizures. These occur with a more severe form of preeclampsia, called eclampsia. DIAGNOSIS  You may be diagnosed with hypertension during a regular prenatal exam. At each prenatal visit, you may have:  Your blood pressure checked.  A urine test to check for protein in your urine. The type of hypertension you are diagnosed with depends on when you developed it. It also depends on your specific blood pressure reading. 6. Developing hypertension before 20 weeks of pregnancy is consistent with chronic hypertension. 7. Developing hypertension after 20 weeks of pregnancy is consistent with gestational hypertension. 8. Hypertension with increased urinary protein is diagnosed as preeclampsia. 9. Blood pressure measurements that stay above 160 systolic or 110 diastolic are a sign of severe preeclampsia. TREATMENT Treatment for hypertension during pregnancy varies. Treatment depends on the type of hypertension and how serious it is.  If you take medicine for chronic hypertension, you may need to switch medicines.  Medicines called ACE inhibitors should not be taken during pregnancy.  Low-dose aspirin may be suggested for women who have risk factors for preeclampsia.  If you have gestational hypertension, you may need to take a blood pressure medicine that is safe during pregnancy. Your  health care provider will recommend the correct medicine.  If you have severe preeclampsia, you may need to be in the hospital. Health care providers will watch you  and your baby very closely. You also may need to take medicine called magnesium sulfate to prevent seizures and lower blood pressure.  Sometimes, an early delivery is needed. This may be the case if the condition worsens. It would be done to protect you and your baby. The only cure for preeclampsia is delivery.  Your health care provider may recommend that you take one low-dose aspirin (81 mg) each day to help prevent high blood pressure during your pregnancy if you are at risk for preeclampsia. You may be at risk for preeclampsia if:  You had preeclampsia or eclampsia during a previous pregnancy.  Your baby did not grow as expected during a previous pregnancy.  You experienced preterm birth with a previous pregnancy.  You experienced a separation of the placenta from the uterus (placental abruption) during a previous pregnancy.  You experienced the loss of your baby during a previous pregnancy.  You are pregnant with more than one baby.  You have other medical conditions, such as diabetes or an autoimmune disease. HOME CARE INSTRUCTIONS  Schedule and keep all of your regular prenatal care appointments. This is important.  Take medicines only as directed by your health care provider. Tell your health care provider about all medicines you take.  Eat as little salt as possible.  Get regular exercise.  Do not drink alcohol.  Do not use tobacco products.  Do not drink products with caffeine.  Lie on your left side when resting. SEEK IMMEDIATE MEDICAL CARE IF:  You have severe abdominal pain.  You have sudden swelling in your hands, ankles, or face.  You gain 4 pounds (1.8 kg) or more in 1 week.  You vomit repeatedly.  You have vaginal bleeding.  You do not feel your baby moving as much.  You have a headache.  You have blurred or double vision.  You have muscle twitching or spasms.  You have shortness of breath.  You have blue fingernails or lips.  You have  blood in your urine. MAKE SURE YOU:  Understand these instructions.  Will watch your condition.  Will get help right away if you are not doing well or get worse.   This information is not intended to replace advice given to you by your health care provider. Make sure you discuss any questions you have with your health care provider.   Document Released: 01/30/2011 Document Revised: 06/04/2014 Document Reviewed: 12/11/2012 Elsevier Interactive Patient Education 2016 [employer]. Trichomoniasis Trichomoniasis is an infection caused by an organism called Trichomonas. The infection can affect both women and men. In women, the outer female genitalia and the vagina are affected. In men, the penis is mainly affected, but the prostate and other reproductive organs can also be involved. Trichomoniasis is a sexually transmitted infection (STI) and is most often passed to another person through sexual contact.  RISK FACTORS  Having unprotected sexual intercourse.  Having sexual intercourse with an infected partner. SIGNS AND SYMPTOMS  Symptoms of trichomoniasis in women include:  Abnormal gray-green frothy vaginal discharge.  Itching and irritation of the vagina.  Itching and irritation of the area outside the vagina. Symptoms of trichomoniasis in men include:   Penile discharge with or without pain.  Pain during urination. This results from inflammation of the urethra. DIAGNOSIS  Trichomoniasis may be found during a Pap  test or physical exam. Your health care provider may use one of the following methods to help diagnose this infection:  Testing the pH of the vagina with a test tape.  Using a vaginal swab test that checks for the Trichomonas organism. A test is available that provides results within a few minutes.  Examining a urine sample.  Testing vaginal secretions. Your health care provider may test you for other STIs, including HIV. TREATMENT  10. You may be given medicine  to fight the infection. Women should inform their health care provider if they could be or are pregnant. Some medicines used to treat the infection should not be taken during pregnancy. 11. Your health care provider may recommend over-the-counter medicines or creams to decrease itching or irritation. 12. Your sexual partner will need to be treated if infected. 13. Your health care provider may test you for infection again 3 months after treatment. HOME CARE INSTRUCTIONS   Take medicines only as directed by your health care provider.  Take over-the-counter medicine for itching or irritation as directed by your health care provider.  Do not have sexual intercourse while you have the infection.  Women should not douche or wear tampons while they have the infection.  Discuss your infection with your partner. Your partner may have gotten the infection from you, or you may have gotten it from your partner.  Have your sex partner get examined and treated if necessary.  Practice safe, informed, and protected sex.  See your health care provider for other STI testing. SEEK MEDICAL CARE IF:   You still have symptoms after you finish your medicine.  You develop abdominal pain.  You have pain when you urinate.  You have bleeding after sexual intercourse.  You develop a rash.  Your medicine makes you sick or makes you throw up (vomit). MAKE SURE YOU:  Understand these instructions.  Will watch your condition.  Will get help right away if you are not doing well or get worse.   This information is not intended to replace advice given to you by your health care provider. Make sure you discuss any questions you have with your health care provider.   Document Released: 11/07/2000 Document Revised: 06/04/2014 Document Reviewed: 02/23/2013 Elsevier Interactive Patient Education 2016 [employer]. Threatened Miscarriage A threatened miscarriage occurs when you have vaginal bleeding during  your first 20 weeks of pregnancy but the pregnancy has not ended. If you have vaginal bleeding during this time, your health care provider will do tests to make sure you are still pregnant. If the tests show you are still pregnant and the developing baby (fetus) inside your womb (uterus) is still growing, your condition is considered a threatened miscarriage. A threatened miscarriage does not mean your pregnancy will end, but it does increase the risk of losing your pregnancy (complete miscarriage). CAUSES  The cause of a threatened miscarriage is usually not known. If you go on to have a complete miscarriage, the most common cause is an abnormal number of chromosomes in the developing baby. Chromosomes are the structures inside cells that hold all your genetic material. Some causes of vaginal bleeding that do not result in miscarriage include:  Having sex.  Having an infection.  Normal hormone changes of pregnancy.  Bleeding that occurs when an egg implants in your uterus. RISK FACTORS Risk factors for bleeding in early pregnancy include:  Obesity.  Smoking.  Drinking excessive amounts of alcohol or caffeine.  Recreational drug use. SIGNS AND SYMPTOMS  Light vaginal bleeding.  Mild abdominal pain or cramps. DIAGNOSIS  If you have bleeding with or without abdominal pain before 20 weeks of pregnancy, your health care provider will do tests to check whether you are still pregnant. One important test involves using sound waves and a computer (ultrasound) to create images of the inside of your uterus. Other tests include an internal exam of your vagina and uterus (pelvic exam) and measurement of your baby's heart rate.  You may be diagnosed with a threatened miscarriage if:  Ultrasound testing shows you are still pregnant.  Your baby's heart rate is strong.  A pelvic exam shows that the opening between your uterus and your vagina (cervix) is closed.  Your heart rate and blood  pressure are stable.  Blood tests confirm you are still pregnant. TREATMENT  No treatments have been shown to prevent a threatened miscarriage from going on to a complete miscarriage. However, the right home care is important.  HOME CARE INSTRUCTIONS  14. Make sure you keep all your appointments for prenatal care. This is very important. 15. Get plenty of rest. 16. Do not have sex or use tampons if you have vaginal bleeding. 17. Do not douche. 18. Do not smoke or use recreational drugs. 19. Do not drink alcohol. 20. Avoid caffeine. SEEK MEDICAL CARE IF:  You have light vaginal bleeding or spotting while pregnant.  You have abdominal pain or cramping.  You have a fever. SEEK IMMEDIATE MEDICAL CARE IF:  You have heavy vaginal bleeding.  You have blood clots coming from your vagina.  You have severe low back pain or abdominal cramps.  You have fever, chills, and severe abdominal pain. MAKE SURE YOU:  Understand these instructions.  Will watch your condition.  Will get help right away if you are not doing well or get worse.   This information is not intended to replace advice given to you by your health care provider. Make sure you discuss any questions you have with your health care provider.   Document Released: 05/14/2005 Document Revised: 05/19/2013 Document Reviewed: 03/10/2013 Elsevier Interactive Patient Education 2016 [employer].          Expedited Partner Therapy:  Information Sheet for Patients and Partners               You have been offered expedited partner therapy (EPT). This information sheet contains important information and warnings you need to be aware of, so please read it carefully.   Expedited Partner Therapy (EPT) is the clinical practice of treating the sexual partners of persons who receive chlamydia, gonorrhea, or trichomoniasis diagnoses by providing medications or prescriptions to the patient. Patients then provide partners with these  therapies without the health-care provider having examined the partner. In other words, EPT is a convenient, fast and private way for patients to help their sexual partners get treated.   Chlamydia and gonorrhea are bacterial infections you get from having sex with a person who is already infected. Trichomoniasis (or trich) is a very common sexually transmitted infection (STI) that is caused by infection with a protozoan parasite called Trichomonas vaginalis.  Many people with these infections dont know it because they feel fine, but without treatment these infections can cause serious health problems, such as pelvic inflammatory disease, ectopic pregnancy, infertility and increased risk of HIV.   It is important to get treated as soon as possible to protect your health, to avoid spreading these infections to others, and to prevent yourself from becoming  re-infected. The good news is these infections can be easily cured with proper antibiotic medicine. The best way to take care of your self is to see a doctor or go to your local health department. If you are not able to see a doctor or other medical provider, you should take EPT.    Recommended Medication: EPT for Chlamydia:  Azithromycin (Zithromax) 1 gram orally in a single dose EPT for Gonorrhea:  Cefixime (Suprax) 400 milligrams orally in a single dose PLUS azithromycin (Zithromax) 1 gram orally in a single dose EPT for Trichomoniasis:  Metronidazole (Flagyl) 2 grams orally in a single dose   These medicines are very safe. However, you should not take them if you have ever had an allergic reaction (like a rash) to any of these medicines: azithromycin (Zithromax), erythromycin, clarithromycin (Biaxin), metronidazole (Flagyl), tinidazole (Tindimax). If you are uncertain about whether you have an allergy, call your medical provider or pharmacist before taking this medicine. If you have a serious, long-term illness like kidney, liver or heart  disease, colitis or stomach problems, or you are currently taking other prescription medication, talk to your provider before taking this medication.   Women: If you have lower belly pain, pain during sex, vomiting, or a fever, do not take this medicine. Instead, you should see a medical provider to be certain you do not have pelvic inflammatory disease (PID). PID can be serious and lead to infertility, pregnancy problems or chronic pelvic pain.   Pregnant Women: It is very important for you to see a doctor to get pregnancy services and pre-natal care. These antibiotics for EPT are safe for pregnant women, but you still need to see a medical provider as soon as possible. It is also important to note that Doxycycline is an alternative therapy for chlamydia, but it should not be taken by someone who is pregnant.   Men: If you have pain or swelling in the testicles or a fever, do not take this medicine and see a medical provider.     Men who have sex with men (MSM): MSM in [place] continue to experience high rates of syphilis and HIV. Many MSM with gonorrhea or chlamydia could also have syphilis and/or HIV and not know it. If you are a man who has sex with other men, it is very important that you see a medical provider and are tested for HIV and syphilis. EPT is not recommended for gonorrhea for MSM.  Recommended treatment for gonorrhea for MSM is Rocephin (shot) AND azithromycin due to decreased cure rate.  Please see your medical provider if this is the case.    Along with this information sheet is a prescription for the medicine. If you receive a prescription it will be in your name and will indicate your date of birth, or it will be in the name of Expedited Partner Therapy.   In either case, you can have the prescription filled at a pharmacy. You will be responsible for the cost of the medicine, unless you have prescription drug coverage. In that case, you could provide your name so the pharmacy  could bill your health plan.   Take the medication as directed. Some people will have a mild, upset stomach, which does not last long. AVOID alcohol 24 hours after taking metronidazole (Flagyl) to reduce the possibility of a disulfiram-like reaction (severe vomiting and abdominal pain).  After taking the medicine, do not have sex for 7 days. Do not share this medicine or give it  to anyone else. It is important to tell everyone you have had sex with in the last 60 days that they need to go and get tested for sexually transmitted infections.   Ways to prevent these and other sexually transmitted infections (STIs):    Abstain from sex. This is the only sure way to avoid getting an STI.   Use barrier methods, such as condoms, consistently and correctly.   Limit the number of sexual partners.   Have regular physical exams, including testing for STIs.   For more information about EPT or other issues pertaining to an STI, please contact your medical provider or the [facility] Department at [phone] or [email or web address]

## 2015-10-21 LAB — GC/CHLAMYDIA PROBE AMP (~~LOC~~) NOT AT ARMC
Chlamydia: NEGATIVE
Neisseria Gonorrhea: NEGATIVE

## 2015-10-21 LAB — HIV ANTIBODY (ROUTINE TESTING W REFLEX): HIV Screen 4th Generation wRfx: NONREACTIVE

## 2016-08-24 ENCOUNTER — Encounter (HOSPITAL_COMMUNITY): Payer: Self-pay
